# Patient Record
Sex: Female | Born: 1965 | Race: White | Hispanic: No | Marital: Married | Smoking: Never smoker
Health system: Southern US, Community
[De-identification: ages and names within clinical notes are randomized; demographics above are authoritative.]

## PROBLEM LIST (undated history)

## (undated) DIAGNOSIS — R51 Headache: Secondary | ICD-10-CM

## (undated) DIAGNOSIS — Z9889 Other specified postprocedural states: Secondary | ICD-10-CM

## (undated) DIAGNOSIS — Z85850 Personal history of malignant neoplasm of thyroid: Secondary | ICD-10-CM

## (undated) DIAGNOSIS — R112 Nausea with vomiting, unspecified: Secondary | ICD-10-CM

## (undated) DIAGNOSIS — R0602 Shortness of breath: Secondary | ICD-10-CM

## (undated) DIAGNOSIS — M2550 Pain in unspecified joint: Secondary | ICD-10-CM

## (undated) DIAGNOSIS — F1990 Other psychoactive substance use, unspecified, uncomplicated: Secondary | ICD-10-CM

## (undated) DIAGNOSIS — R413 Other amnesia: Secondary | ICD-10-CM

## (undated) DIAGNOSIS — I10 Essential (primary) hypertension: Secondary | ICD-10-CM

## (undated) DIAGNOSIS — R519 Headache, unspecified: Secondary | ICD-10-CM

## (undated) DIAGNOSIS — G4730 Sleep apnea, unspecified: Secondary | ICD-10-CM

## (undated) DIAGNOSIS — R0600 Dyspnea, unspecified: Secondary | ICD-10-CM

## (undated) DIAGNOSIS — F32A Depression, unspecified: Secondary | ICD-10-CM

## (undated) DIAGNOSIS — E039 Hypothyroidism, unspecified: Secondary | ICD-10-CM

## (undated) DIAGNOSIS — C801 Malignant (primary) neoplasm, unspecified: Secondary | ICD-10-CM

## (undated) DIAGNOSIS — F319 Bipolar disorder, unspecified: Secondary | ICD-10-CM

## (undated) DIAGNOSIS — K5900 Constipation, unspecified: Secondary | ICD-10-CM

## (undated) DIAGNOSIS — N183 Chronic kidney disease, stage 3 unspecified: Secondary | ICD-10-CM

## (undated) DIAGNOSIS — E079 Disorder of thyroid, unspecified: Secondary | ICD-10-CM

## (undated) DIAGNOSIS — E559 Vitamin D deficiency, unspecified: Secondary | ICD-10-CM

## (undated) DIAGNOSIS — F329 Major depressive disorder, single episode, unspecified: Secondary | ICD-10-CM

## (undated) DIAGNOSIS — M549 Dorsalgia, unspecified: Secondary | ICD-10-CM

## (undated) DIAGNOSIS — K219 Gastro-esophageal reflux disease without esophagitis: Secondary | ICD-10-CM

## (undated) DIAGNOSIS — F419 Anxiety disorder, unspecified: Secondary | ICD-10-CM

## (undated) DIAGNOSIS — G43909 Migraine, unspecified, not intractable, without status migrainosus: Secondary | ICD-10-CM

## (undated) HISTORY — DX: Dorsalgia, unspecified: M54.9

## (undated) HISTORY — PX: TUBAL LIGATION: SHX77

## (undated) HISTORY — PX: WISDOM TOOTH EXTRACTION: SHX21

## (undated) HISTORY — DX: Chronic kidney disease, stage 3 unspecified: N18.30

## (undated) HISTORY — PX: THYROIDECTOMY: SHX17

## (undated) HISTORY — DX: Depression, unspecified: F32.A

## (undated) HISTORY — PX: LASIK: SHX215

## (undated) HISTORY — DX: Other psychoactive substance use, unspecified, uncomplicated: F19.90

## (undated) HISTORY — PX: OTHER SURGICAL HISTORY: SHX169

## (undated) HISTORY — DX: Vitamin D deficiency, unspecified: E55.9

## (undated) HISTORY — DX: Pain in unspecified joint: M25.50

## (undated) HISTORY — DX: Personal history of malignant neoplasm of thyroid: Z85.850

## (undated) HISTORY — DX: Essential (primary) hypertension: I10

## (undated) HISTORY — PX: APPENDECTOMY: SHX54

## (undated) HISTORY — PX: HEMORROIDECTOMY: SUR656

## (undated) HISTORY — DX: Shortness of breath: R06.02

## (undated) HISTORY — DX: Constipation, unspecified: K59.00

## (undated) HISTORY — DX: Anxiety disorder, unspecified: F41.9

---
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
## 1998-03-02 DIAGNOSIS — C801 Malignant (primary) neoplasm, unspecified: Secondary | ICD-10-CM

## 1998-03-02 HISTORY — PX: THYROIDECTOMY: SHX17

## 1998-03-02 HISTORY — DX: Malignant (primary) neoplasm, unspecified: C80.1

## 1999-09-04 ENCOUNTER — Other Ambulatory Visit: Admission: RE | Admit: 1999-09-04 | Discharge: 1999-09-04 | Payer: Self-pay | Admitting: Obstetrics & Gynecology

## 2000-05-12 ENCOUNTER — Encounter: Payer: Self-pay | Admitting: Obstetrics and Gynecology

## 2000-05-12 ENCOUNTER — Encounter: Admission: RE | Admit: 2000-05-12 | Discharge: 2000-05-12 | Payer: Self-pay | Admitting: Obstetrics and Gynecology

## 2000-12-24 ENCOUNTER — Other Ambulatory Visit: Admission: RE | Admit: 2000-12-24 | Discharge: 2000-12-24 | Payer: Self-pay | Admitting: Obstetrics and Gynecology

## 2004-10-21 ENCOUNTER — Inpatient Hospital Stay (HOSPITAL_COMMUNITY): Admission: AD | Admit: 2004-10-21 | Discharge: 2004-10-23 | Payer: Self-pay | Admitting: Obstetrics and Gynecology

## 2004-12-08 ENCOUNTER — Other Ambulatory Visit: Admission: RE | Admit: 2004-12-08 | Discharge: 2004-12-08 | Payer: Self-pay | Admitting: Obstetrics and Gynecology

## 2006-03-06 ENCOUNTER — Inpatient Hospital Stay (HOSPITAL_COMMUNITY): Admission: AD | Admit: 2006-03-06 | Discharge: 2006-03-07 | Payer: Self-pay | Admitting: Obstetrics and Gynecology

## 2006-03-06 ENCOUNTER — Encounter (INDEPENDENT_AMBULATORY_CARE_PROVIDER_SITE_OTHER): Payer: Self-pay | Admitting: Specialist

## 2006-04-02 ENCOUNTER — Encounter (INDEPENDENT_AMBULATORY_CARE_PROVIDER_SITE_OTHER): Payer: Self-pay | Admitting: Specialist

## 2006-04-02 ENCOUNTER — Ambulatory Visit (HOSPITAL_COMMUNITY): Admission: AD | Admit: 2006-04-02 | Discharge: 2006-04-02 | Payer: Self-pay | Admitting: Obstetrics and Gynecology

## 2006-12-27 ENCOUNTER — Inpatient Hospital Stay (HOSPITAL_COMMUNITY): Admission: AD | Admit: 2006-12-27 | Discharge: 2006-12-27 | Payer: Self-pay | Admitting: Obstetrics and Gynecology

## 2006-12-27 IMAGING — US US OB COMP LESS 14 WK
1 series · 14 of 28 positions shown · non-contrast
Comparison: none

CLINICAL DATA: 40-year-old female with positive pregnancy test.  Estimated gestation of 6 weeks 5 days by LMP.  Pelvic pain.  
 OBSTETRICAL ULTRASOUND <14 WKS AND TRANSVAGINAL OB US:
TECHNIQUE: Both transabdominal and transvaginal ultrasound examinations were performed for complete evaluation of the gestation as well as the maternal uterus, adnexal regions, and pelvic cul-de-sac.

[Series 1: us ob comp less 14 wk · 0.30mm/px · 14 of 33 slices shown]
[im 2/33]
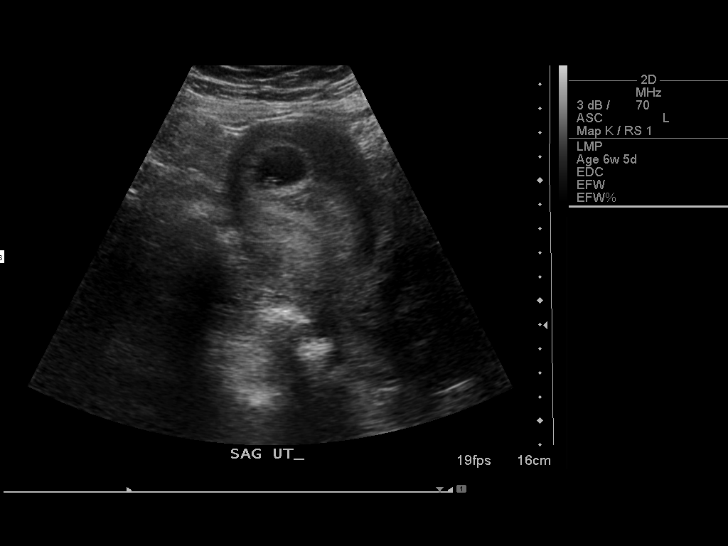
[im 4/33]
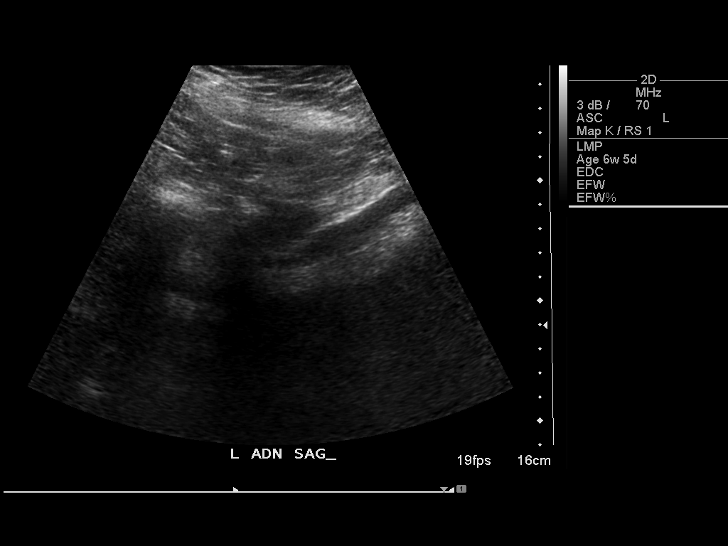
[im 6/33]
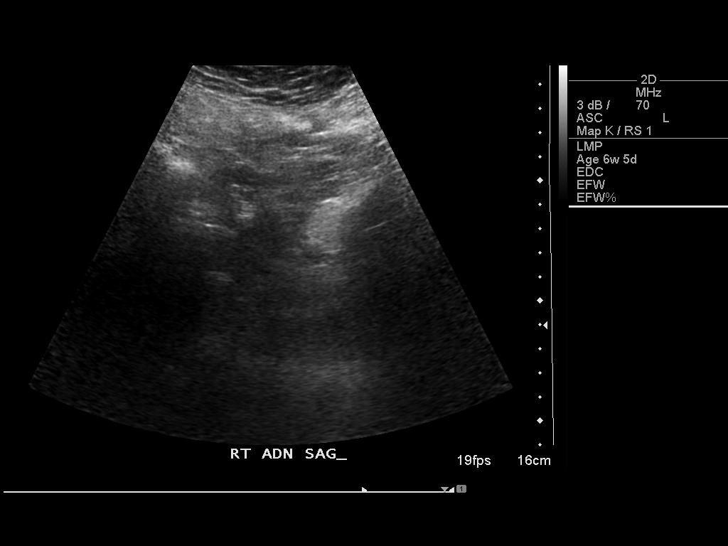
[im 9/33]
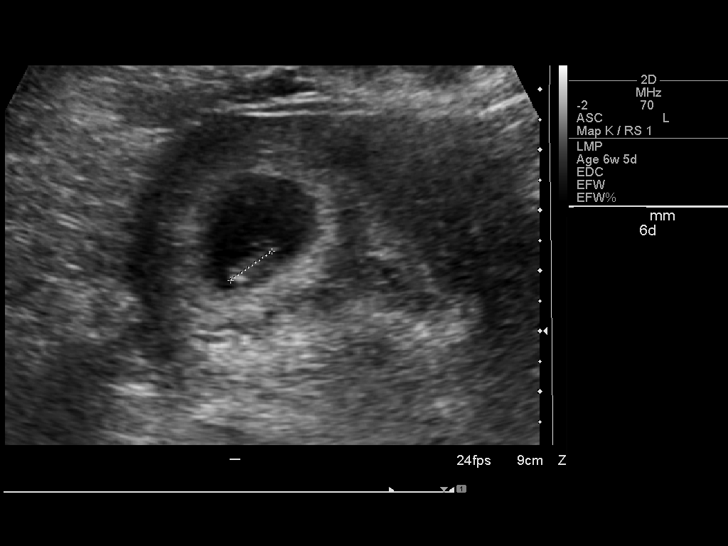
[im 11/33]
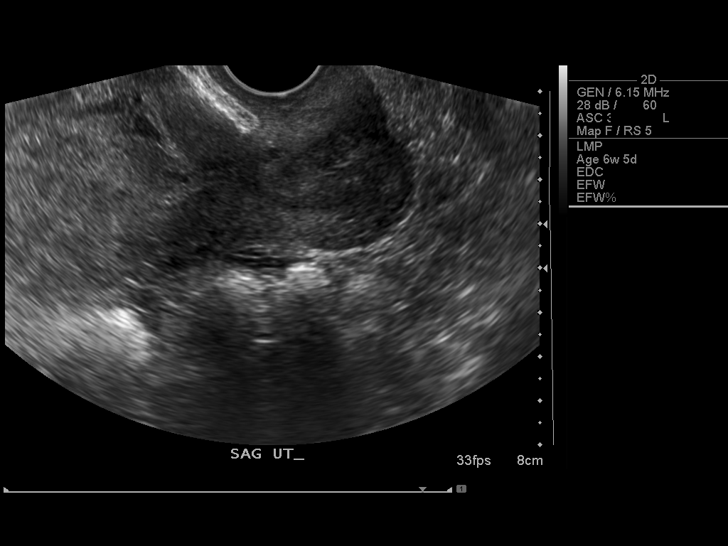
[im 14/33]
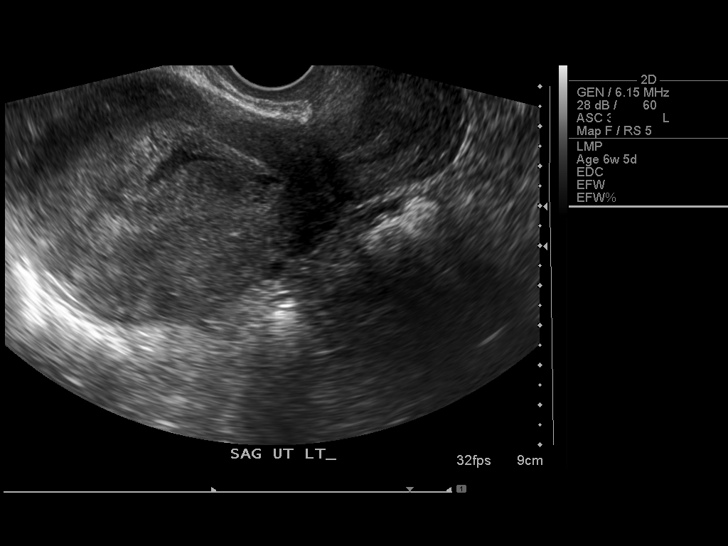
[im 16/33]
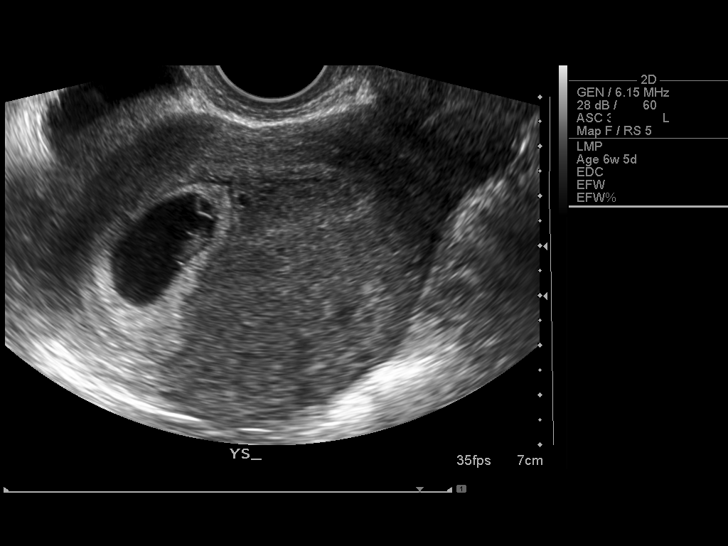
[im 18/33]
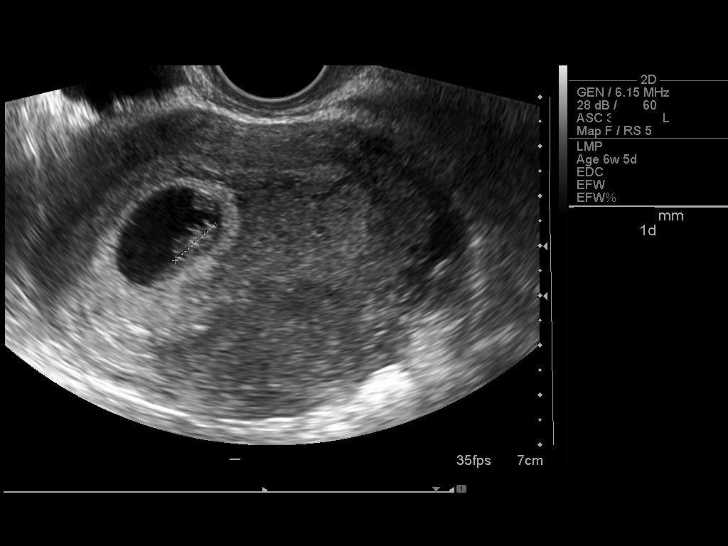
[im 21/33]
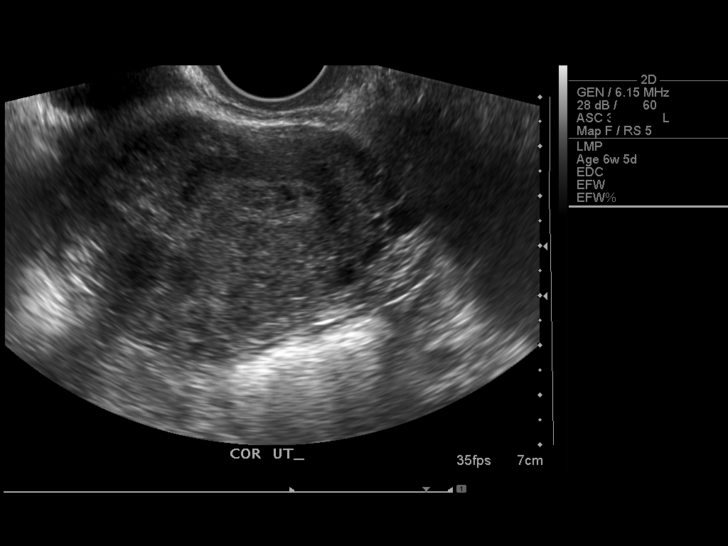
[im 23/33]
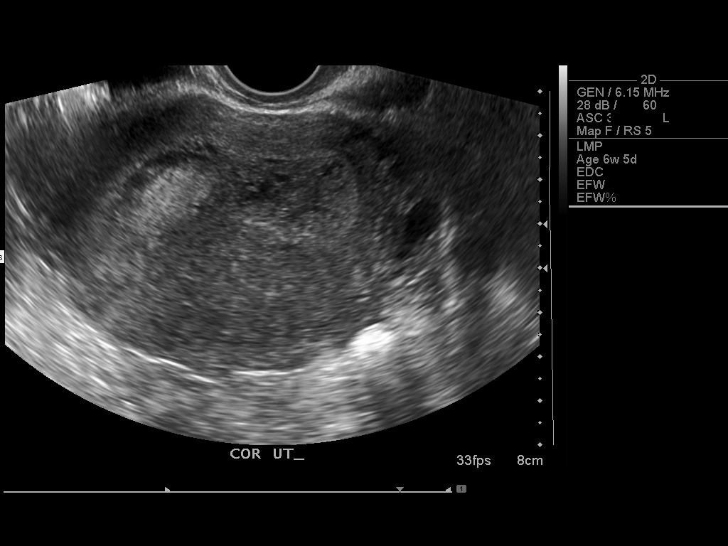
[im 25/33]
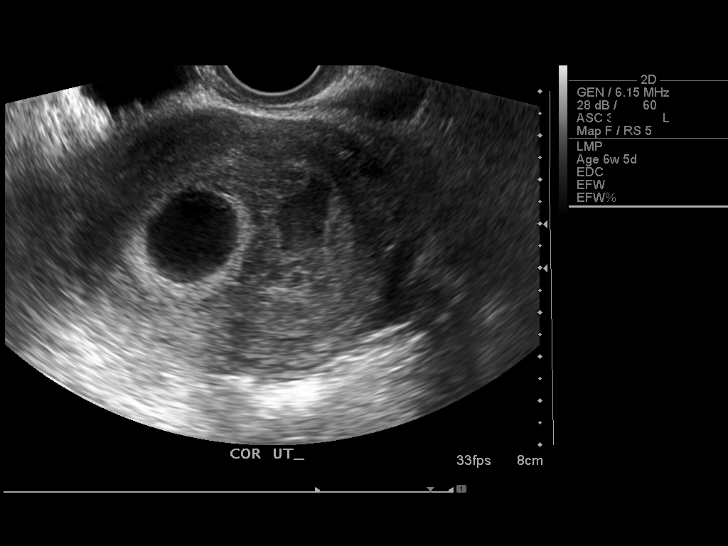
[im 28/33]
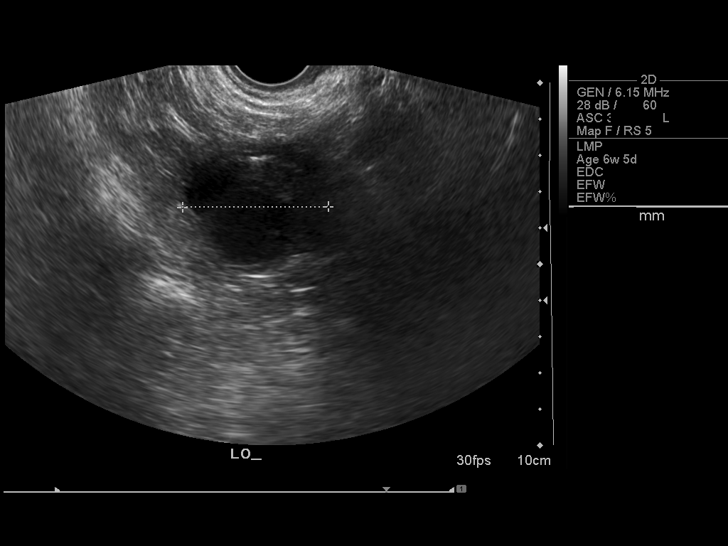
[im 30/33]
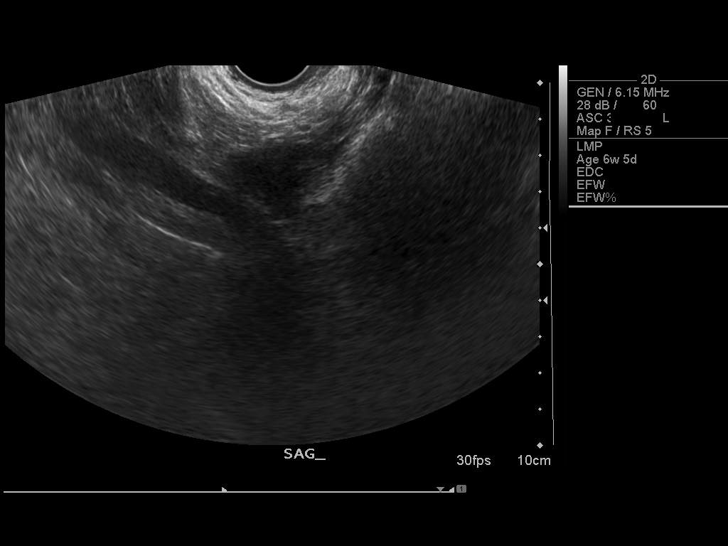
[im 33/33]
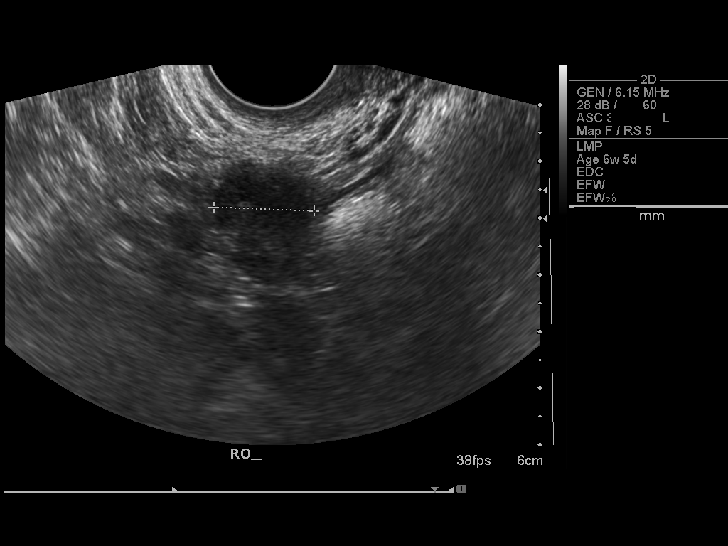

[14 of 28 positions shown; findings below may reference images not displayed]

FINDINGS: There is a single living intrauterine gestation with yolk sac and fetal pole.  Crown-rump length measures 10 mm, corresponding to a gestational age of 7 weeks 0 days.  Small to moderate subchorionic hemorrhage is noted.  The ovaries bilaterally are unremarkable.  No free fluid or adnexal masses identified.
IMPRESSION: 1.  Single living intrauterine pregnancy with estimated gestational age of 7 weeks 0 days by this ultrasound.  
 2.  Small to moderate subchorionic hemorrhage.

## 2007-06-30 ENCOUNTER — Inpatient Hospital Stay (HOSPITAL_COMMUNITY): Admission: AD | Admit: 2007-06-30 | Discharge: 2007-06-30 | Payer: Self-pay | Admitting: Obstetrics and Gynecology

## 2007-07-11 ENCOUNTER — Inpatient Hospital Stay (HOSPITAL_COMMUNITY): Admission: AD | Admit: 2007-07-11 | Discharge: 2007-07-11 | Payer: Self-pay | Admitting: Obstetrics and Gynecology

## 2007-07-26 ENCOUNTER — Ambulatory Visit (HOSPITAL_COMMUNITY): Admission: RE | Admit: 2007-07-26 | Discharge: 2007-07-26 | Payer: Self-pay | Admitting: Obstetrics and Gynecology

## 2007-08-01 ENCOUNTER — Inpatient Hospital Stay (HOSPITAL_COMMUNITY): Admission: RE | Admit: 2007-08-01 | Discharge: 2007-08-01 | Payer: Self-pay | Admitting: Obstetrics and Gynecology

## 2007-08-02 ENCOUNTER — Inpatient Hospital Stay (HOSPITAL_COMMUNITY): Admission: AD | Admit: 2007-08-02 | Discharge: 2007-08-06 | Payer: Self-pay | Admitting: Obstetrics and Gynecology

## 2007-08-03 ENCOUNTER — Encounter (INDEPENDENT_AMBULATORY_CARE_PROVIDER_SITE_OTHER): Payer: Self-pay | Admitting: Obstetrics and Gynecology

## 2007-08-09 ENCOUNTER — Encounter: Admission: RE | Admit: 2007-08-09 | Discharge: 2007-09-07 | Payer: Self-pay | Admitting: Obstetrics and Gynecology

## 2007-09-08 ENCOUNTER — Encounter: Admission: RE | Admit: 2007-09-08 | Discharge: 2007-10-08 | Payer: Self-pay | Admitting: Obstetrics and Gynecology

## 2007-10-09 ENCOUNTER — Encounter: Admission: RE | Admit: 2007-10-09 | Discharge: 2007-11-08 | Payer: Self-pay | Admitting: Obstetrics and Gynecology

## 2007-11-09 ENCOUNTER — Encounter: Admission: RE | Admit: 2007-11-09 | Discharge: 2007-12-09 | Payer: Self-pay | Admitting: Obstetrics and Gynecology

## 2007-12-10 ENCOUNTER — Encounter: Admission: RE | Admit: 2007-12-10 | Discharge: 2008-01-09 | Payer: Self-pay | Admitting: Obstetrics and Gynecology

## 2008-01-10 ENCOUNTER — Encounter: Admission: RE | Admit: 2008-01-10 | Discharge: 2008-02-08 | Payer: Self-pay | Admitting: Obstetrics and Gynecology

## 2008-02-09 ENCOUNTER — Encounter: Admission: RE | Admit: 2008-02-09 | Discharge: 2008-03-06 | Payer: Self-pay | Admitting: Obstetrics and Gynecology

## 2010-03-23 ENCOUNTER — Encounter: Payer: Self-pay | Admitting: Obstetrics and Gynecology

## 2010-05-07 ENCOUNTER — Other Ambulatory Visit: Payer: Self-pay | Admitting: Obstetrics and Gynecology

## 2010-05-07 ENCOUNTER — Ambulatory Visit (HOSPITAL_COMMUNITY)
Admission: RE | Admit: 2010-05-07 | Discharge: 2010-05-07 | Disposition: A | Discharge status: Home / Self Care | Payer: PRIVATE HEALTH INSURANCE | Source: Ambulatory Visit | Attending: Obstetrics and Gynecology | Admitting: Obstetrics and Gynecology

## 2010-05-07 DIAGNOSIS — D250 Submucous leiomyoma of uterus: Secondary | ICD-10-CM | POA: Insufficient documentation

## 2010-05-07 DIAGNOSIS — N920 Excessive and frequent menstruation with regular cycle: Secondary | ICD-10-CM | POA: Insufficient documentation

## 2010-05-07 LAB — CBC
MCH: 23.7 pg — ABNORMAL LOW (ref 26.0–34.0)
RBC: 3.88 MIL/uL (ref 3.87–5.11)

## 2010-05-09 NOTE — Op Note (Signed)
Yolanda Jordan, Yolanda Jordan                   ACCOUNT NO.:  [id number]  MEDICAL RECORD NO.:  [id number]           PATIENT TYPE:  O  LOCATION:  [facility]                          [facility]  PHYSICIAN:  Huel Cote, M.D. DATE OF BIRTH:  16-Jan-1966  DATE OF PROCEDURE:  05/07/2010 DATE OF DISCHARGE:                              OPERATIVE REPORT   PREOPERATIVE DIAGNOSIS:  Menorrhagia.  POSTOPERATIVE DIAGNOSIS:  Menorrhagia with submucosal fibroid.  PROCEDURES: 1. Hysteroscopy. 2. NovaSure ablation. 3. Resection of fibroid.  SURGEON:  Huel Cote, MD  ANESTHESIA:  MAC and 1% paracervical block.  FINDINGS:  There is a submucosal fibroid on a stalk approximately 1 cm in diameter in the upper left cavity of the uterus on the anterior surface.  Normal cavity was noted, otherwise.  SPECIMEN:  Fibroid pieces and endometrial curettings were sent to Pathology.  The cavity length itself was 6.  The cavity width was 4.5.  A treatment time of 1 minute and 10 seconds, NovaSure ablation.  Hysteroscopic deficit was 75 mL.  There was minimal blood loss.  PROCEDURE:  The patient was taken to the operating room where MAC anesthesia was obtained without difficulty.  She was then prepped and draped in a normal sterile fashion in dorsal lithotomy position.  With a speculum in the vagina, the cervix was identified.  Injected with approximately 2 mL of 1% lidocaine and grasped with tenaculum.  The remaining 2 and 10 o'clock areas were injected with 9 mL each of 1% lidocaine for paracervical block.  The uterus was then sounded to 11 cm. Cervical length measured at 5 cm for a cavity length of 6.  The dilators passed very easily due to prior Cytotec treatment by the patient.  The resectoscope was then introduced into the uterine cavity and with sorbitol, the cavity was inspected and found to be normal except for the small submucosal fibroid noted in the upper anterior left quadrant. This was then  serially resected with the resectoscope off the stalk that was slightly smaller and 1 cm and appeared to be resected in entirety. Once this was resected, endometrial curettings were performed and handed off to Pathology.  Once these were performed, the cavity was then flushed out of the sorbitol and switched over to lactated Ringer's which was flushed through the cavity multiple times to clear out the sorbitol. Once this was performed with approximately 300 mL, the NovaSure unit was introduced into the uterine cavity and the unit deployed easily.  The cavity width was noted to be 4.5 cm and the unit was then tested with the cervical seal in place and passed its uterine cavity check.  This then was activated for a treatment time of 1 minute and 10 seconds and no other issues noted.  The unit was then removed and the hysteroscope replaced in the uterine cavity with a good blanching of the endometrial cavity noted in all areas for apparent successful treatment.  This was then removed and the tenaculum removed.  There was no active bleeding and the patient was then awakened and taken to the recovery room in good  condition.     Huel Cote, M.D.     KR/MEDQ  D:  05/07/2010  T:  05/07/2010  Job:  [id number]  Electronically Signed by Huel Cote M.D. on 05/08/2010 11:20:17 AM

## 2010-05-09 NOTE — H&P (Signed)
Yolanda Jordan, Yolanda Jordan                   ACCOUNT NO.:  [id number]  MEDICAL RECORD NO.:  [id number]           PATIENT TYPE:  O  LOCATION:  [facility]                           [facility]  PHYSICIAN:  Huel Cote, M.D. DATE OF BIRTH:  May 19, 1965  DATE OF ADMISSION:  05/07/2010 DATE OF DISCHARGE:                             HISTORY & PHYSICAL   The patient is a 45 year old G4, P4 who is coming in for a scheduled hysteroscopy fibroid resection and probable NovaSure ablation.  The patient reported menorrhagia which has been ongoing for several years and has become much worse over the last year.  Cycles are very heavy every month, lasting 5-7 days and have actually made the patient somewhat anemic with a hemoglobin of 10 at her last annual exam.  The patient also has some stress incontinence, which is troublesome, but does not wish to intervene with that problem at this time.  She has been worked up with saline-infused ultrasound which revealed a uterine cavity that was essentially normal.  There was a 10 x 9 x 8 small submucosal fibroid protruding into the cavity along the left fundal wall.  The cavity itself appears slightly on the large side of normal.  The patient had been counseled as to her options and desired to proceed with a hysteroscopy, resection of the submucosal fibroid, and NovaSure ablation as it would appear that this would work based on the cavity findings at the time of surgery.  Her past obstetrical history is significant for two vaginal deliveries and two low transverse cesarean sections, the second one was a tubal ligation performed as well.  Her past medical history significant for bipolar hypothyroidism and psoriasis.  PAST SURGICAL HISTORY:  Low transverse C-section x2, thyroidectomy, and appendectomy.  Her past GYN history is significant for stress urinary incontinence and mild pelvic prolapse.  Her current medications include lithium, Abilify, Zoloft, and  Synthroid.  She has no known drug allergies.  PHYSICAL EXAMINATION:  VITAL SIGNS:  The patient's weight is 234 pounds, height is 5 feet 9 inches. CARDIAC:  Regular rate and rhythm. LUNGS:  Clear. ABDOMEN:  Soft and nontender.  She has a probable lipoma in the left lower quadrant. PELVIC:  She has normal external genitalia with a moderate rectocele. Cervix is well supported and a mild cystocele.  Uterus is well supported in upper limits of normal in size.  Bimanual has no masses.  The patient was counseled as to her options including a possible posterior repair and evaluation for sling placement.  She did not wish any prolapse repair at this time, wishing only to proceed with the ablation.  We discussed the risks and benefits of ablation including uterine perforation, bleeding, and possible infection.  The patient understands these risks and desires to proceed as stated.  We discussed that we will proceed hysteroscopically and resect the small fibroid first and evaluate the cavity for the ablation and if appropriate proceed with that.  She will use Cytotec 400 mcg prior to procedure to have her cervix more easily ready for dilation.  Again all questions were answered and  the procedure was discussed in detail and the patient will proceed as directed.     Huel Cote, M.D.     KR/MEDQ  D:  05/06/2010  T:  05/06/2010  Job:  [id number]  Electronically Signed by Huel Cote M.D. on 05/08/2010 11:20:13 AM

## 2010-07-15 NOTE — H&P (Signed)
Yolanda Jordan, Yolanda Jordan                   ACCOUNT NO.:  [id number]   MEDICAL RECORD NO.:  [id number]          PATIENT TYPE:  INP   LOCATION:  NA                            [facility]   PHYSICIAN:  Huel Cote, M.D. DATE OF BIRTH:  05-19-1965   DATE OF ADMISSION:  08/02/2007  DATE OF DISCHARGE:                              HISTORY & PHYSICAL   The patient's day of admission is today, August 02, 2007, at 8:30 p.m. at  the [facility] facility on the labor and delivery unit.   HISTORY AND PHYSICAL:  The patient is a 45 year old, G5, P3-0-1-3 who is  coming in for a scheduled induction at 38+ weeks gestation given an  ongoing finding of macrosomia.  The patient's estimated due date is August 15, 2007, by a 9-week ultrasound consistent with LMP, and the patient  had a recent ultrasound with estimated fetal weight of 9 pounds 7  ounces. At the same time, she had an amniocentesis performed which did  demonstrate mature lungs.  The patient's baby has also been breech in  presentation in the last few weeks, however, spontaneously converted to  vertex.  The patient's pregnancy is complicated by gestational diabetes  for which she has been on significant amount of insulin. She is  currently on Dilantin 68 units nightly and is taking Humalog with each  meal for coverage.  The patient also has had polyhydramnios noted at  times during the pregnancy; however, with the most current ultrasound,  her AFI was normal at 15.  As stated, the last weight 2 days ago was  estimated at 9 pounds 7 ounces at the 90th percentile.  She is of  advanced maternal age.  However, had normal genetic screening as well as  normal ultrasound.  A first trimester screen was normal, and she did  have a fetal echocardiogram performed secondary to the diabetes which  was also normal. The patient's pregnancy has also been complicated by  history of a C-section with her second pregnancy.  Her first delivery  was vaginal  delivery followed by C-section followed by another vaginal  delivery, and it is her desire that she VBAC with this delivery as well   Her prenatal labs are as follows.  O positive, antibody negative, RPR  nonreactive, rubella immune, hepatitis B surface antigen negative, HIV  negative, GC negative, chlamydia negative, group B strep negative, 1-  hour Glucola not performed secondary to diabetes. Thyroid was normal.  First trimester screen was normal   Gestational diabetes, was originally started on glyburide and covered  with this effectively until she reached approximately 33 weeks. At that  point, she was noted to have elevated fasting blood sugars which were no  longer controlled, and most of her postprandials were 150-160, and at  that point she was started on insulin. She also at that visit at 33  weeks was placed on Zoloft 50 mg p.o. nightly for a history of  depression which had been worsening with the pregnancy. As stated, she  had a normal fetal echocardiogram performed this pregnancy.  PAST OBSTETRICAL HISTORY:  In 1986, she had a 7 pound 11 ounce infant  vaginally without difficulty.  In 1997, she had a 6 pound 10 ounce  infant by C-section secondary to fetal distress. In 2006, she had a  vaginal delivery of an 8 pound infant via VBAC without complications. In  2008, she had a spontaneous loss at 17 weeks IUFD.   PAST MEDICAL HISTORY:  1. History of diabetes as stated.  The patient probably has some      glucose intolerance even when not pregnant.  However, usually does      go to more normal blood sugars after delivery.  2. History of depression, more significant postpartum depression in      the past and, therefore, has been started on Zoloft.  3. History of thyroidectomy with a low resulting thyroid on Synthroid,      stable.   PAST GYN HISTORY:  Questionable history of HPV, none recently.   PAST SURGICAL HISTORY:  1. Thyroidectomy in 1999.  2. Appendectomy as a  teenager.  3. C-section in 1997.   ALLERGIES:  None.   CURRENT MEDICATIONS:  1. Lantus 68 mg p.o. nightly.  2. Sliding scale Humalog coverage.  3. Zoloft 50 mg p.o. nightly.  4. Synthroid p.o. daily.   PHYSICAL EXAMINATION:  VITAL SIGNS:  On admission, the patient's weight  is 268 pounds. Blood pressure normal at 120/72.  CARDIAC:  Regular rate and rhythm.  LUNGS:  Clear.  ABDOMEN:  Soft and nontender and gravid.  Fundal height is approximately  40-41.  PELVIC:  Cervix is 50, closed, and high.   PLAN:  The patient had an amniocentesis performed on August 01, 2007, with  an LS ratio of 2.5 with PG present indicating mature fetal lungs given  the large size of the baby at 9 pounds 7 ounces, the patient has  requested a trial of induction.  It was discussed with her extensively  that, with an unfavorable cervix and history of a C-section, we are  somewhat limited in our induction protocol and that it could be  unsuccessful leading to a repeat C-section.  The patient is well aware  of this and strongly desires a trial of induction. For this reason, we  have planned admission with possible Foley bulb and Pitocin induction to  proceed after admission. The ultrasound performed on August 01, 2007,  revealed a vertex fetus, although she had been breech several weeks ago.  The risks and benefits of VBAC have been discussed with the patient  extensively, and she understands the risk of uterine rupture with a  trial of labor, however, desires this trial.  She will be instructed to  use 1/2 of her Lantus dose this evening at the hospital and will have  her blood sugars managed in labor as needed.      Huel Cote, M.D.  Electronically Signed     KR/MEDQ  D:  08/02/2007  T:  08/02/2007  Job:  [id number]

## 2010-07-15 NOTE — Discharge Summary (Signed)
NAMEJANNELLE, Yolanda Jordan                   ACCOUNT NO.:  [id number]   MEDICAL RECORD NO.:  [id number]          PATIENT TYPE:  INP   LOCATION:  9115                          [facility]   PHYSICIAN:  Sherron Monday, MD        DATE OF BIRTH:  December 18, 1965   DATE OF ADMISSION:  08/02/2007  DATE OF DISCHARGE:  08/06/2007                               DISCHARGE SUMMARY   ADMITTING DIAGNOSIS:  Intrauterine pregnancy at term, gestational  diabetes, history of low-transverse cesarean section, desires vaginal  birth after cesarean section.   DISCHARGE DIAGNOSIS:  Intrauterine pregnancy at term, gestational  diabetes, history of low-transverse cesarean section, desires vaginal  birth after cesarean section delivered via repeat low transverse  cesarean section.   HISTORY OF PRESENT ILLNESS:  A 45 year old G5, P3-0-1-3 who is scheduled  for induction at 38 plus weeks given the finding of macrosomia and fetal  lung maturity by amnio.  She was dated by a 9-week ultrasound consistent  with an LMP.  The recent ultrasound estimated fetal weight at 9 pounds 7  ounces.  Her pregnancy is complicated by gestational diabetes, and she  is taking 60 units of Lantus nightly and Humalog with meals.  She also  has advanced maternal age; however, she had normal genetic screening and  ultrasound.  Gestational diabetes for which she was originally started  on glyburide which controlled her sugars until she is 33 weeks. At this  point, she had been switched to Lantus and Humalog secondary to  uncontrolled sugars.   PAST MEDICAL HISTORY:  1. Significant for diabetes with some history of glucose intolerance      even when she is not pregnant.  2. Past history of depression.  She is started back on her Zoloft.  3. History of thyroidectomy with resulting hypothyroidism, and she is      on Synthroid.   PAST SURGICAL HISTORY:  Significant for low-transverse cesarean section  as well as thyroidectomy as well as an  appendectomy.   PAST OB/GYN HISTORY:  G1 is 7 pounds 11 ounce, NSVD; G2 is 6 pound 10  ounce by cesarean section secondary to fetal distress; 2006, VBAC 8  pound infant; 2008 17 week IFD; and G5 is present pregnancy.  She has a  questionable history of HPV, and no problems with that recently.   MEDICATIONS:  Currently include Lantus, Humalog, Zoloft, and Synthroid.   ALLERGIES:  None.   SOCIAL HISTORY:  Denies alcohol, tobacco, or drug use. She is married.   PHYSICAL EXAMINATION:  On admission, she is afebrile.  Vital signs  stable and benign exam.   She was admitted after discussion of an unfavorable cervix and induction  of a VBAC and a Foley bulb was placed after her cervical exam was found  to be 1-2 cm dilated.  After the Foley came out, she was started on  Pitocin.  In her labor she was started on insulin drip secondary to  elevated sugars.  She received her epidural the morning of August 03, 2007.  AROM was performed with a  fetal scalp electrode secondary to  polyhydramnios for controlled descent of the vertex.  She was monitored  closely.  She continued in the labor and did not progress, 5-6 cm  dilated, 50% effaced, and baby remained out of the pelvis.  The decision  was made to proceed with a cesarean section as well as postpartum  bilateral tubal ligation.  The patient delivered a viable female infant  with Apgars of 9 at 9 and weight of 9 pounds 3 ounces, and tubes and  ovaries were noted to be within normal limits and the tubal ligation was  performed.  Her postoperative course was relatively uncomplicated.  She  had some problems with nausea and dizziness, however, pain was okay.  Her sugars were followed and were reasonably controlled.  She will  continue to monitor them after she is discharged.  She was discharged to  home on postoperative day #3, doing well with routine discharge  instructions and numbers to call with any questions or problems.  She  was also discharged  to home with prescription for Motrin, Vicodin, and  prenatal vitamins, and she will continue to check her fingertip blood  sugars.  She will follow up in the office in approximately 2 weeks.  She  will plan to breast-feed and has a bilateral tubal ligation for  contraception.  Her hemoglobin decreased from 12.2 to 10.3.  She is O  positive and rubella immune.      Sherron Monday, MD  Electronically Signed     JB/MEDQ  D:  08/06/2007  T:  08/06/2007  Job:  [id number]

## 2010-07-15 NOTE — Op Note (Signed)
Yolanda Jordan, Yolanda Jordan                   ACCOUNT NO.:  [id number]   MEDICAL RECORD NO.:  [id number]          PATIENT TYPE:  INP   LOCATION:  9120                          [facility]   PHYSICIAN:  Huel Cote, M.D. DATE OF BIRTH:  14-Mar-1965   DATE OF PROCEDURE:  08/03/2007  DATE OF DISCHARGE:                               OPERATIVE REPORT   PREOPERATIVE DIAGNOSES:  1. Term pregnancy at 92 plus weeks' delivery.  2. Previous cesarean section.  3. Macrosomia and polyhydramnios.  4. Status post amnio with mature fetal lungs.  5. Desires permanent sterilization.  6. Arrest of dilation and induced labor.   POSTOPERATIVE DIAGNOSES:  1. Term pregnancy at 45 plus weeks' delivery.  2. Previous cesarean section.  3. Macrosomia and polyhydramnios.  4. Status post amnio with mature fetal lungs.  5. Desires permanent sterilization.  6. Arrest of dilation and induced labor.   PROCEDURES:  Repeat low-transverse cesarean section and bilateral tubal  ligation.   SURGEON:  Huel Cote, MD   ANESTHESIA:  Epidural.   FINDINGS:  A viable female infant in the vertex presentation, Apgars were  9 and 9, and weight was 9 pounds 3 ounces.  There was nuchal cord x1.  Placenta was normal.  Tubes and ovaries were normal.  The placenta was  sent to Labor and Delivery.   ESTIMATED BLOOD LOSS:  800 mL.   URINE OUTPUT:  175 mL of clear urine.   IV FLUIDS:  1600 mL LR.   PROCEDURE:  The patient was taken to the operating room where epidural  anesthesia was found to be adequate by Allis clamp test.  She was then  prepped and draped in a normal sterile fashion in the dorsal supine  position with a leftward tilt.  A Pfannenstiel skin incision was made  through a preexisting scar and carried through to the underlying layer  of fascia by sharp dissection and Bovie cautery.  The fascia was then  nicked in the midline, and the incision was extended laterally with Mayo  scissors.  The inferior aspect  of the incision was grasped with Kocher  clamps and elevated and dissected off the underlying rectus muscles.  The superior aspect was likewise dissected off the rectus muscles.  The  rectus muscles were then separated in the midline, and the peritoneal  cavity entered carefully with blunt dissection.  This incision was then  extended both superiorly and anteriorly with careful attention to avoid  both bowel and bladder.  The Alexis self-retaining wound retractor was  then placed within the incision, and the lower uterine segment was  exposed nicely.  A transverse incision was then made in the cavity  itself, entered bluntly, this was then extended bluntly, and the  infant's head delivered easily atraumatically.  The nose and mouth were  bulb suctioned.  Nuchal cord x1 was reduced over the head, and the  remainder of the infant was delivered without difficulty.  Nose and  mouth were bulb suctioned, and the cord was clamped, cut, and he infant  handed to the awaiting pediatricians.  The placenta was then expressed  spontaneously and handed off for cord blood donation.  The uterus was  cleared of all clots and debris with moist lap sponge.  The uterine  incision itself was closed in two layers, the first a running locked  layer and the second an imbricating layer of both of 0 chromic.  The  incision then appeared hemostatic, so attention was turned to the  patient's fallopian tubes.  The left fallopian tube was grasped with a  Babcock clamp, elevated and dissected, and traced out to its fimbriated  end.  A small opening was made in the mesosalpinx with a Bovie cautery  and two free ties of 0 plain passed around the tubes in approximately 2-  to 3-cm segment.  This was then amputated and the pedicles remaining  were treated with Bovie cautery.  There was no active bleeding noted,  and this was returned to the abdomen.  In a similar fashion, the right  tube was traced out to its fimbriated end  and in a similar fashion, tied  off with 2 free ties of 0 plain and amputated approximately 2- to 3-cm  segment of tube.  The free pedicles were likewise cauterized with Bovie  cautery and the tube returned to the abdomen.  At this point, the  incision was re-inspected carefully.  No active bleeding was noted.  The  bowel and omentum were quite protuberant and thus, the rectus muscles  and peritoneum were closed with 0 Vicryl in a running fashion carefully.  The fascia was then closed with 0 Vicryl, the subcutaneous tissue  reapproximated both in a running fashion, and the skin was closed with  staples.  Sponge, lap, and needle counts were correct x2, and the  patient was taken to the recovery room in stable condition.      Huel Cote, M.D.  Electronically Signed     KR/MEDQ  D:  08/03/2007  T:  08/04/2007  Job:  [id number]

## 2010-11-27 LAB — FLM(FETAL LUNG MATURITY), AMNIOTIC FLUID
Fetal/Lung Maturity: 44.8
Gestational Age: 38

## 2010-11-27 LAB — CBC
Hemoglobin: 12.2
MCHC: 34.8
Platelets: 260
RBC: 3.27 — ABNORMAL LOW
RBC: 4
RDW: 14.1
RDW: 14.4
WBC: 10.6 — ABNORMAL HIGH
WBC: 14.9 — ABNORMAL HIGH

## 2010-11-27 LAB — LSPG (L/S RATIO WITH PG)-AMNIO FLUID

## 2010-12-10 LAB — URINALYSIS, ROUTINE W REFLEX MICROSCOPIC
Glucose, UA: NEGATIVE
Hgb urine dipstick: NEGATIVE
Nitrite: NEGATIVE
Urobilinogen, UA: 0.2

## 2011-07-28 DIAGNOSIS — Z85850 Personal history of malignant neoplasm of thyroid: Secondary | ICD-10-CM | POA: Insufficient documentation

## 2011-08-17 ENCOUNTER — Encounter (HOSPITAL_BASED_OUTPATIENT_CLINIC_OR_DEPARTMENT_OTHER): Payer: Self-pay | Admitting: Family Medicine

## 2011-08-17 ENCOUNTER — Emergency Department (HOSPITAL_BASED_OUTPATIENT_CLINIC_OR_DEPARTMENT_OTHER): Payer: Medicaid Other

## 2011-08-17 ENCOUNTER — Emergency Department (HOSPITAL_BASED_OUTPATIENT_CLINIC_OR_DEPARTMENT_OTHER)
Admission: EM | Admit: 2011-08-17 | Discharge: 2011-08-17 | Disposition: A | Discharge status: Home / Self Care | Payer: Medicaid Other | Attending: Emergency Medicine | Admitting: Emergency Medicine

## 2011-08-17 DIAGNOSIS — E079 Disorder of thyroid, unspecified: Secondary | ICD-10-CM | POA: Insufficient documentation

## 2011-08-17 DIAGNOSIS — R609 Edema, unspecified: Secondary | ICD-10-CM | POA: Insufficient documentation

## 2011-08-17 DIAGNOSIS — F319 Bipolar disorder, unspecified: Secondary | ICD-10-CM | POA: Insufficient documentation

## 2011-08-17 DIAGNOSIS — R0602 Shortness of breath: Secondary | ICD-10-CM | POA: Insufficient documentation

## 2011-08-17 HISTORY — DX: Bipolar disorder, unspecified: F31.9

## 2011-08-17 HISTORY — DX: Disorder of thyroid, unspecified: E07.9

## 2011-08-17 LAB — URINALYSIS, ROUTINE W REFLEX MICROSCOPIC
Bilirubin Urine: NEGATIVE
Nitrite: NEGATIVE
Protein, ur: NEGATIVE mg/dL
Specific Gravity, Urine: 1.008 (ref 1.005–1.030)
Urobilinogen, UA: 0.2 mg/dL (ref 0.0–1.0)
pH: 7 (ref 5.0–8.0)

## 2011-08-17 LAB — COMPREHENSIVE METABOLIC PANEL
Albumin: 4 g/dL (ref 3.5–5.2)
BUN: 6 mg/dL (ref 6–23)
Calcium: 9.4 mg/dL (ref 8.4–10.5)
Creatinine, Ser: 0.6 mg/dL (ref 0.50–1.10)
GFR calc Af Amer: >90 mL/min (ref >90)
Glucose, Bld: 106 mg/dL — ABNORMAL HIGH (ref 70–99)
Potassium: 3.5 mEq/L (ref 3.5–5.1)
Sodium: 138 mEq/L (ref 135–145)
Total Bilirubin: 0.1 mg/dL — ABNORMAL LOW (ref 0.3–1.2)

## 2011-08-17 LAB — URINE MICROSCOPIC-ADD ON: WBC, UA: NONE SEEN WBC/hpf (ref <3)

## 2011-08-17 LAB — CBC
HCT: 38.9 % (ref 36.0–46.0)
Hemoglobin: 12.5 g/dL (ref 12.0–15.0)

## 2011-08-17 LAB — PRO B NATRIURETIC PEPTIDE: Pro B Natriuretic peptide (BNP): 25.8 pg/mL (ref 0–125)

## 2011-08-17 MED ORDER — LIDOCAINE 4 % EX CREA
TOPICAL_CREAM | CUTANEOUS | Status: AC
  Filled 2011-08-17: qty 5

## 2011-08-17 MED ORDER — LIDOCAINE 4 % EX CREA
TOPICAL_CREAM | Freq: Once | CUTANEOUS | Status: AC
  Administered 2011-08-17: 1 via TOPICAL

## 2011-08-17 NOTE — ED Provider Notes (Signed)
History     CSN: [id number]  Arrival date & time 08/17/11  1148   First MD Initiated Contact with Patient 08/17/11 1212      Chief Complaint  Patient presents with  . Shortness of Breath    (Consider location/radiation/quality/duration/timing/severity/associated sxs/prior treatment) HPI Patient is a 46 year old female with a history of thyroid disease as well as bipolar disorder who presents today complaining of 3+ pitting edema in the bilateral lower extremities that occurred 6 days ago. At that time the patient took her father's Lasix for several days. She was at the [facility] when this occurred. She reports that this has happened one time previous several months ago when she was away from home in [place]. She has never had a medical workup for this at the time when it has occurred. Patient denies any chest pain or shortness of breath currently that she did have chest pain and shortness of breath when this occurred previously. She has no shortness of breath currently as well as no lower extremity edema.  She has no history of kidney disease or heart failure. The patient reports taking all her medications including her Synthroid. There are no other associated or modifying factors. Past Medical History  Diagnosis Date  . Thyroid disease   . Bipolar 1 disorder     Past Surgical History  Procedure Date  . Thyroidectomy   . Appendectomy   . Cesarean section   . Tubal ligation     History reviewed. No pertinent family history.  History  Substance Use Topics  . Smoking status: Never Smoker   . Smokeless tobacco: Not on file  . Alcohol Use: Yes    OB History    Grav Para Term Preterm Abortions TAB SAB Ect Mult Living                  Review of Systems  Constitutional: Negative.   HENT: Negative.   Eyes: Negative.   Respiratory: Positive for shortness of breath.   Cardiovascular: Positive for leg swelling.  Gastrointestinal: Negative.   Genitourinary:  Negative.   Musculoskeletal: Negative.   Skin: Negative.   Neurological: Negative.   Hematological: Negative.   Psychiatric/Behavioral: Negative.   All other systems reviewed and are negative.    Allergies  Morphine and related  Home Medications   Current Outpatient Rx  Name Route Sig Dispense Refill  . ABILIFY PO Oral Take by mouth.    . TEGRETOL PO Oral Take by mouth.    Marland Kitchen LAMICTAL PO Oral Take by mouth.    Marland Kitchen LORAZEPAM 1 MG PO TABS Oral Take 1 mg by mouth 2 (two) times daily.    Marland Kitchen ZOLOFT PO Oral Take by mouth.      BP 123/72  Pulse 84  Temp 98.5 F (36.9 C) (Oral)  Resp 18  Ht 5\' 9"  (1.753 m)  Wt 255 lb (115.667 kg)  BMI 37.66 kg/m2  SpO2 98%  Physical Exam  Nursing note and vitals reviewed. GEN: Well-developed, well-nourished female in no distress HEENT: Atraumatic, normocephalic. Oropharynx clear without erythema EYES: PERRLA BL, no scleral icterus. NECK: Trachea midline, no meningismus CV: regular rate and rhythm. No murmurs, rubs, or gallops PULM: No respiratory distress.  No crackles, wheezes, or rales. GI: soft, non-tender. No guarding, rebound, or tenderness. + bowel sounds  GU: deferred Neuro: cranial nerves grossly 2-12 intact, no abnormalities of strength or sensation, A and O x 3 MSK: Patient moves all 4 extremities symmetrically, no deformity, edema,  or injury noted Skin: No rashes petechiae, purpura, or jaundice Psych: no abnormality of mood   ED Course  Procedures (including critical care time)   Indication: shortness of breath Please note this EKG was reviewed extemporaneously by myself.   Date: 08/17/2011  Rate: 93  Rhythm: normal sinus rhythm  QRS Axis: normal  Intervals: normal  ST/T Wave abnormalities: nonspecific T wave changes  Conduction Disutrbances:none  Narrative Interpretation: No acute ischemic changes  Old EKG Reviewed: none available      Labs Reviewed  COMPREHENSIVE METABOLIC PANEL - Abnormal; Notable for the  following:    Glucose, Bld 106 (*)     Total Bilirubin 0.1 (*)     All other components within normal limits  URINALYSIS, ROUTINE W REFLEX MICROSCOPIC - Abnormal; Notable for the following:    Hgb urine dipstick SMALL (*)     All other components within normal limits  PRO B NATRIURETIC PEPTIDE  CBC  URINE MICROSCOPIC-ADD ON   Dg Chest 2 View  08/17/2011  *RADIOLOGY REPORT*  Clinical Data: Shortness of breath for 1 week.  CHEST - 2 VIEW  Comparison: None.  Findings: Lungs are clear.  Heart size is normal.  No pneumothorax or pleural fluid.  IMPRESSION: Negative chest.  Original Report Authenticated By: Bernadene Bell. D'ALESSIO, M.D.     1. Shortness of breath       MDM  Patient was evaluated by myself. Based on evaluation and laboratory workup was performed given the patient reported having severe shortness of breath peripheral edema. She noted that she had no chest pain, peripheral edema, or shortness of breath today. She reports that all of this had resolved just this morning. Patient I spoke at length about not taking her father's medications and that she shouldn't be evaluated immediately if she actually is having the symptoms. Patient did have an EKG that was unremarkable as well as normal BNP, CBC, renal panel, troponin, and EKG. Patient has plans for followup the primary care physician. She was discharged in good condition and told to return immediately if she had return of her symptoms rather than to just take her father's Lasix.        Cyndra Numbers, MD 08/17/11 [phone]

## 2011-08-17 NOTE — ED Notes (Signed)
Ela-max cream applied to right AC. Will stick for labs after cream has numbed pt.

## 2011-08-17 NOTE — ED Notes (Addendum)
Pt c/o shob x 1 wk, edema to feet and legs and pt sts she treated self with lasix she borrowed. Pt sts edema is now gone but shob remains. Pt sts abdomen feels "tight" and left calf "hurts a little".

## 2011-08-17 NOTE — ED Notes (Signed)
MD at bedside. 

## 2011-08-17 NOTE — ED Notes (Signed)
[place] and [place] "3+ pitting edema, shortness of breath and a pulse ox of 89%".  States she came home early and had been taking Lasix 40 mg daily.  Stopped taking Tegretol 1 week ago.

## 2011-08-17 NOTE — Discharge Instructions (Signed)
Shortness of Breath Shortness of breath (dyspnea) is the feeling of uneasy breathing. Shortness of breath does not always mean that there is a life-threatening illness. However, shortness of breath requires immediate medical care. CAUSES  Causes for shortness of breath include:  Not enough oxygen in the air (as with high altitudes or with a smoke-filled room).   Short-term (acute) lung disease, including:   Infections such as pneumonia.   Fluid in the lungs, such as heart failure.   A blood clot in the lungs (pulmonary embolism).   Lasting (chronic) lung diseases.   Heart disease (heart attack, angina, heart failure, and others).   Low red blood cells (anemia).   Poor physical fitness. This can cause shortness of breath when you exercise.   Chest or back injuries or stiffness.   Being overweight (obese).   Anxiety. This can make you feel like you are not getting enough air.  DIAGNOSIS  Serious medical problems can usually be found during your physical exam. Many tests may also be done to determine why you are having shortness of breath. Tests include:  Chest X-rays.   Lung function tests.   Blood tests.   Electrocardiography.   Exercise testing.   A cardiac echo.   Imaging scans.  Your caregiver may not be able to find a cause for your shortness of breath after your exam. In this case, it is important to have a follow-up exam with your caregiver as directed.  HOME CARE INSTRUCTIONS   Do not smoke. Smoking is a common cause of shortness of breath. Ask for help to stop smoking.   Avoid being around chemicals that may bother your breathing (paint fumes, dust).   Rest as needed. Slowly resume your usual activities.   If medicines were prescribed, take them as directed for the full length of time directed. This includes oxygen and any inhaled medicines.   Follow up with your caregiver as directed. Waiting to do so or failure to follow up could result in worsening of  your condition and possible disability or death.   Be sure you understand what to do or who to call if your shortness of breath worsens.  SEEK MEDICAL CARE IF:   Your condition does not improve in the time expected.   You have a hard time doing your normal activities even with rest.   You have any side effects or problems with the medicines prescribed.   You develop any new symptoms.  SEEK IMMEDIATE MEDICAL CARE IF:   Your shortness of breath is getting worse.   You feel lightheaded, faint, or develop a cough not controlled with medicines.   You start coughing up blood.   You have pain with breathing.   You have chest pain or pain in your arms, shoulders, or abdomen.   You have a fever.   You are unable to walk up stairs or exercise the way you normally do.   Your symptoms are getting worse.  Document Released: 11/11/2000 Document Revised: 02/05/2011 Document Reviewed: 06/29/2007 ExitCare Patient Information 2012 [employer].Shortness of Breath Shortness of breath (dyspnea) is the feeling of uneasy breathing. Shortness of breath does not always mean that there is a life-threatening illness. However, shortness of breath requires immediate medical care. CAUSES  Causes for shortness of breath include:  Not enough oxygen in the air (as with high altitudes or with a smoke-filled room).   Short-term (acute) lung disease, including:   Infections such as pneumonia.   Fluid in the   lungs, such as heart failure.   A blood clot in the lungs (pulmonary embolism).   Lasting (chronic) lung diseases.   Heart disease (heart attack, angina, heart failure, and others).   Low red blood cells (anemia).   Poor physical fitness. This can cause shortness of breath when you exercise.   Chest or back injuries or stiffness.   Being overweight (obese).   Anxiety. This can make you feel like you are not getting enough air.  DIAGNOSIS  Serious medical problems can usually be found  during your physical exam. Many tests may also be done to determine why you are having shortness of breath. Tests include:  Chest X-rays.   Lung function tests.   Blood tests.   Electrocardiography.   Exercise testing.   A cardiac echo.   Imaging scans.  Your caregiver may not be able to find a cause for your shortness of breath after your exam. In this case, it is important to have a follow-up exam with your caregiver as directed.  HOME CARE INSTRUCTIONS   Do not smoke. Smoking is a common cause of shortness of breath. Ask for help to stop smoking.   Avoid being around chemicals that may bother your breathing (paint fumes, dust).   Rest as needed. Slowly resume your usual activities.   If medicines were prescribed, take them as directed for the full length of time directed. This includes oxygen and any inhaled medicines.   Follow up with your caregiver as directed. Waiting to do so or failure to follow up could result in worsening of your condition and possible disability or death.   Be sure you understand what to do or who to call if your shortness of breath worsens.  SEEK MEDICAL CARE IF:   Your condition does not improve in the time expected.   You have a hard time doing your normal activities even with rest.   You have any side effects or problems with the medicines prescribed.   You develop any new symptoms.  SEEK IMMEDIATE MEDICAL CARE IF:   Your shortness of breath is getting worse.   You feel lightheaded, faint, or develop a cough not controlled with medicines.   You start coughing up blood.   You have pain with breathing.   You have chest pain or pain in your arms, shoulders, or abdomen.   You have a fever.   You are unable to walk up stairs or exercise the way you normally do.   Your symptoms are getting worse.  Document Released: 11/11/2000 Document Revised: 02/05/2011 Document Reviewed: 06/29/2007 ExitCare Patient Information 2012 ExitCare,  LLC. 

## 2011-08-17 NOTE — ED Notes (Signed)
Pt refused IV insertion but agreed to lab collection.

## 2011-08-29 ENCOUNTER — Emergency Department (HOSPITAL_COMMUNITY)
Admission: EM | Admit: 2011-08-29 | Discharge: 2011-08-29 | Disposition: A | Discharge status: Home / Self Care | Payer: Medicaid Other | Attending: Emergency Medicine | Admitting: Emergency Medicine

## 2011-08-29 ENCOUNTER — Encounter (HOSPITAL_COMMUNITY): Payer: Self-pay | Admitting: *Deleted

## 2011-08-29 DIAGNOSIS — R45851 Suicidal ideations: Secondary | ICD-10-CM | POA: Insufficient documentation

## 2011-08-29 DIAGNOSIS — F319 Bipolar disorder, unspecified: Secondary | ICD-10-CM | POA: Insufficient documentation

## 2011-08-29 HISTORY — DX: Malignant (primary) neoplasm, unspecified: C80.1

## 2011-08-29 LAB — COMPREHENSIVE METABOLIC PANEL
Albumin: 4 g/dL (ref 3.5–5.2)
Alkaline Phosphatase: 68 U/L (ref 39–117)
BUN: 7 mg/dL (ref 6–23)
Calcium: 9.4 mg/dL (ref 8.4–10.5)
Potassium: 3.6 mEq/L (ref 3.5–5.1)
Sodium: 137 mEq/L (ref 135–145)
Total Protein: 7.2 g/dL (ref 6.0–8.3)

## 2011-08-29 LAB — CBC WITH DIFFERENTIAL/PLATELET
Basophils Relative: 1 % (ref 0–1)
Eosinophils Absolute: 0.1 10*3/uL (ref 0.0–0.7)
Eosinophils Relative: 1 % (ref 0–5)
MCH: 29.6 pg (ref 26.0–34.0)
MCHC: 32.1 g/dL (ref 30.0–36.0)
Neutrophils Relative %: 77 % (ref 43–77)
Platelets: 287 10*3/uL (ref 150–400)

## 2011-08-29 LAB — RAPID URINE DRUG SCREEN, HOSP PERFORMED
Barbiturates: NOT DETECTED
Benzodiazepines: NOT DETECTED
Cocaine: NOT DETECTED
Opiates: NOT DETECTED

## 2011-08-29 LAB — ETHANOL: Alcohol, Ethyl (B): <11 mg/dL (ref 0–11)

## 2011-08-29 MED ORDER — ARIPIPRAZOLE 5 MG PO TABS: 5.0000 mg | ORAL_TABLET | Freq: Every day | ORAL | Status: DC

## 2011-08-29 MED ORDER — NICOTINE 21 MG/24HR TD PT24: 21.0000 mg | MEDICATED_PATCH | Freq: Every day | TRANSDERMAL | Status: DC

## 2011-08-29 MED ORDER — CARBAMAZEPINE 200 MG PO TABS
800.0000 mg | ORAL_TABLET | Freq: Every day | ORAL | Status: DC
  Administered 2011-08-29: 800 mg via ORAL
  Filled 2011-08-29: qty 4

## 2011-08-29 MED ORDER — LEVOTHYROXINE SODIUM 137 MCG PO TABS: 137.0000 ug | ORAL_TABLET | Freq: Every day | ORAL | Status: DC

## 2011-08-29 MED ORDER — LAMOTRIGINE 150 MG PO TABS: 150.0000 mg | ORAL_TABLET | Freq: Every day | ORAL | Status: DC

## 2011-08-29 MED ORDER — IBUPROFEN 600 MG PO TABS: 600.0000 mg | ORAL_TABLET | Freq: Three times a day (TID) | ORAL | Status: DC | PRN

## 2011-08-29 MED ORDER — SERTRALINE HCL 50 MG PO TABS: 25.0000 mg | ORAL_TABLET | Freq: Every day | ORAL | Status: DC

## 2011-08-29 MED ORDER — ZOLPIDEM TARTRATE 5 MG PO TABS: 5.0000 mg | ORAL_TABLET | Freq: Every evening | ORAL | Status: DC | PRN

## 2011-08-29 MED ORDER — ONDANSETRON HCL 4 MG PO TABS: 4.0000 mg | ORAL_TABLET | Freq: Three times a day (TID) | ORAL | Status: DC | PRN

## 2011-08-29 MED ORDER — ACETAMINOPHEN 325 MG PO TABS: 650.0000 mg | ORAL_TABLET | ORAL | Status: DC | PRN

## 2011-08-29 MED ORDER — ARIPIPRAZOLE 10 MG PO TABS: 10.0000 mg | ORAL_TABLET | Freq: Every day | ORAL | Status: DC

## 2011-08-29 MED ORDER — ALUM & MAG HYDROXIDE-SIMETH 200-200-20 MG/5ML PO SUSP: 30.0000 mL | ORAL | Status: DC | PRN

## 2011-08-29 NOTE — ED Notes (Signed)
Attempted to call report to Psych ED, no RN available, will try back ASAP.

## 2011-08-29 NOTE — Discharge Instructions (Signed)
You need to increase your abilify to 15mg  per day for the next day and then take 20mg  per day after that.  Depression You have signs of depression. This is a common problem. It can occur at any age. It is often hard to recognize. People can suffer from depression and still have moments of enjoyment. Depression interferes with your basic ability to function in life. It upsets your relationships, sleep, eating, and work habits. CAUSES  Depression is believed to be caused by an imbalance in brain chemicals. It may be triggered by an unpleasant event. Relationship crises, a death in the family, financial worries, retirement, or other stressors are normal causes of depression. Depression may also start for no known reason. Other factors that may play a part include medical illnesses, some medicines, genetics, and alcohol or drug abuse. SYMPTOMS   Feeling unhappy or worthless.   Long-lasting (chronic) tiredness or worn-out feeling.   Self-destructive thoughts and actions.   Not being able to sleep or sleeping too much.   Eating more than usual or not eating at all.   Headaches or feeling anxious.   Trouble concentrating or making decisions.   Unexplained physical problems and substance abuse.  TREATMENT  Depression usually gets better with treatment. This can include:  Antidepressant medicines. It can take weeks before the proper dose is achieved and benefits are reached.   Talking with a therapist, clergyperson, counselor, or friend. These people can help you gain insight into your problem and regain control of your life.   Eating a good diet.   Getting regular physical exercise, such as walking for 30 minutes every day.   Not abusing alcohol or drugs.  Treating depression often takes 6 months or longer. This length of treatment is needed to keep symptoms from returning. Call your caregiver and arrange for follow-up care as suggested. SEEK IMMEDIATE MEDICAL CARE IF:   You start to  have thoughts of hurting yourself or others.   Call your local emergency services (911 in U.S[place]   Go to your local medical emergency department.   Call the National Suicide Prevention Lifeline: 1-800-273-TALK 6461388774).  Document Released: 02/16/2005 Document Revised: 02/05/2011 Document Reviewed: 07/19/2009 [facility] Patient Information 2012 [place], [place].  RESOURCE GUIDE  Chronic Pain Problems: Contact [facility]  [phone] Patients need to be referred by their primary care doctor.  Insufficient Money for Medicine: Contact United Way:  call "211" or [facility] 6048834958.  No Primary Care Doctor: - Call [facility] Connect  [phone] - can help you locate a primary care doctor that  accepts your insurance, provides certain services, etc. - Physician Referral [phone]  Agencies that provide inexpensive medical care: - [facility]  [phone] - [facility]  [phone] - [facility]  [phone] [facility]  [phone] - Planned Parenthood  [phone] [facility]  [phone]  Medicaid-accepting [facility] Providers: - [facility]- [address]  [phone], Mon-Fri 9am-7pm, Sat 9am-1pm - [facility]- [address] [place], [address] [place]  [phone] - [facility]- [address], [address] [place]  [phone] [facility]- [address]  [phone] - Renaye Rakers- [address] [place], [address], [phone]  Only accepts [place] patients after they have their name  applied to their card  Self Pay (no insurance) in [facility]: - Sickle Cell Patients: Dr  Willey Blade, [facility]  [address] [place], [phone] - [facility]gent Care- [address] [place]  [phone]       Patrcia Dolly [facility] [place]- [address]       -      [facility]- see information above (Speak to [employer] if you do not have insurance)       -  [facility]- [address] [place], [phone]       -  [facility] [place]- [address],  [phone]       -  [facility]- [address] [phone]       -  Dr Julio Sicks-  [address], [address], [place], [phone]       -  [facility]- [address] [phone]       -  [facility]- [address] [phone], also [address] [phone]       -    [facility]- [address] [place], [phone], 1st & 3rd Saturday   every month, 10am-1pm  1) Find a Doctor and Pay Out of Pocket Although you won't have to find out who is covered by your insurance plan, it is a good idea to ask around and get recommendations. You will then need to call the office and see if the doctor you have chosen will accept you as a new patient and what types of options they offer for patients who are self-pay. Some doctors offer discounts or will set up payment plans for their patients who do not have insurance, but you will need to ask so you aren't surprised when you get to your appointment.  2) Contact Your Local Health Department Not all health departments have doctors that can see patients for sick visits, but many do, so it is worth a call to see if yours does. If you don't know where your local health department is, you can check in your phone book. The CDC also has a tool to help you locate your state's health department, and many state websites also have listings of all of their local health departments.  3) Find a Walk-in Clinic If your illness is not likely to be very severe or complicated, you may want to try a walk in clinic. These are popping up all over the country in pharmacies, drugstores, and shopping centers. They're usually staffed by nurse practitioners or physician assistants that have been trained to treat common illnesses and complaints.  They're usually fairly quick and inexpensive. However, if you have serious medical issues or chronic medical problems, these are probably not your best option  STD Testing - [facility] Department of [facility], STD Clinic, [address] [place], phone [phone] or [phone].  Monday - Friday, call for an appointment. [facility] Department of [employer], STD Clinic, [place] [address], [place], phone [phone] or [phone].  Monday - Friday, call for an appointment.  Abuse/Neglect: [facility] Child Abuse Hotline (480) 787-7469 [facility] Child Abuse Hotline 614-762-1551 (After Hours)  Emergency Shelter:  [facility] 562 146 4458  Maternity Homes: - [facility] at [facility] [place] [facility] [facility] 718-147-1486 - [facility] [phone]  MRSA Hotline #:   (517)105-9745  [facility] of [place]  [facility] Dept. [address]                 [address]         [address] [place] [address]  Blondell Reveal Phone:  [phone]                                  Phone:  [phone]                   Phone:  [phone]  [facility] Mental Health, [phone] - [phone]       -     [facility] in [place], [address],                                  ([phone], [facility] Child Abuse Hotline 873 858 8122 or 902-252-1754 (After Hours)   Behavioral Health Services  Substance Abuse Resources: - Alcohol and Drug Services  [phone] - [facility] 2138690220 - [facility] [place] [phone] Floydene Flock [phone] - Residential & Outpatient Substance Abuse Program  [phone]  Psychological Services: [facility]  7037149749 Services  [phone] - [facility], [phone] [place]. [address] [place], ACCESS LINE: (551)[phone] or [phone], [employer].za  Dental Assistance  If unable to pay or uninsured, contact:  [facility] or [facility]. to become qualified for the adult dental clinic.  Patients with Medicaid: [facility] [address] [facility] [phone] [address], [phone] [address] [phone]  If unable to pay, or uninsured, contact [facility] 807-383-4014) or [facility] Department [phone] in [place], [phone] in [facility]) to become qualified for the adult dental clinic  Other Low-Cost Community Dental Services: - [facility]- [address] [place], [place], [place], [address], [phone], [address]. [address], 2nd and 4th Thursday of the month at 6:30am.  10 clients each day by appointment, can sometimes see walk-in patients if someone does not show for an appointment. [facility]- [address] [place], [place], [address], [phone] - [facility]- [address], [place], [place], [address], [phone] - [facility]- [phone] [facility] Department- [phone] [facility]- [phone]

## 2011-08-29 NOTE — ED Notes (Signed)
Pt assisted to room #42 accompanied by this nurse with 1 bag and 1 bag of personal belongings which were locked in locker #42, Vance Gather, RN aware. Pt remains stable, calm and cooperative at time at transfer.
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
## 2011-08-29 NOTE — ED Notes (Signed)
Telepsych in progress, Renaldo Fiddler present in room with pt.
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
## 2011-08-29 NOTE — ED Provider Notes (Signed)
Medical screening examination/treatment/procedure(s) were performed by non-physician practitioner and as supervising physician I was immediately available for consultation/collaboration.  Celene Kras, MD 08/29/11 [phone]

## 2011-08-29 NOTE — ED Notes (Signed)
Pt is cleared for discharge by telepsych and EDP/ Pts VSS, denies pain or distress. Pt is pleasant and cooperative. Pt verbalized teach back understanding of discharge instructions and follow-up care. Pt will transport self home, car is here at hospital. NAD noted.

## 2011-10-13 ENCOUNTER — Emergency Department (HOSPITAL_BASED_OUTPATIENT_CLINIC_OR_DEPARTMENT_OTHER)
Admission: EM | Admit: 2011-10-13 | Discharge: 2011-10-13 | Disposition: A | Discharge status: Home / Self Care | Payer: Medicaid Other | Attending: Emergency Medicine | Admitting: Emergency Medicine

## 2011-10-13 ENCOUNTER — Emergency Department (HOSPITAL_BASED_OUTPATIENT_CLINIC_OR_DEPARTMENT_OTHER): Payer: Medicaid Other

## 2011-10-13 ENCOUNTER — Encounter (HOSPITAL_BASED_OUTPATIENT_CLINIC_OR_DEPARTMENT_OTHER): Payer: Self-pay | Admitting: Emergency Medicine

## 2011-10-13 DIAGNOSIS — E079 Disorder of thyroid, unspecified: Secondary | ICD-10-CM | POA: Insufficient documentation

## 2011-10-13 DIAGNOSIS — R0989 Other specified symptoms and signs involving the circulatory and respiratory systems: Secondary | ICD-10-CM | POA: Insufficient documentation

## 2011-10-13 DIAGNOSIS — F319 Bipolar disorder, unspecified: Secondary | ICD-10-CM | POA: Insufficient documentation

## 2011-10-13 DIAGNOSIS — Z9089 Acquired absence of other organs: Secondary | ICD-10-CM | POA: Insufficient documentation

## 2011-10-13 DIAGNOSIS — R0609 Other forms of dyspnea: Secondary | ICD-10-CM | POA: Insufficient documentation

## 2011-10-13 DIAGNOSIS — R0600 Dyspnea, unspecified: Secondary | ICD-10-CM

## 2011-10-13 LAB — BASIC METABOLIC PANEL
CO2: 26 mEq/L (ref 19–32)
Calcium: 10.5 mg/dL (ref 8.4–10.5)
Creatinine, Ser: 0.9 mg/dL (ref 0.50–1.10)

## 2011-10-13 LAB — CBC WITH DIFFERENTIAL/PLATELET
Basophils Absolute: 0 10*3/uL (ref 0.0–0.1)
Basophils Relative: 0 % (ref 0–1)
Eosinophils Absolute: 0.2 10*3/uL (ref 0.0–0.7)
Eosinophils Relative: 1 % (ref 0–5)
HCT: 38.3 % (ref 36.0–46.0)
MCH: 30 pg (ref 26.0–34.0)
MCHC: 32.4 g/dL (ref 30.0–36.0)
MCV: 92.7 fL (ref 78.0–100.0)
Monocytes Absolute: 0.8 10*3/uL (ref 0.1–1.0)
RDW: 13.1 % (ref 11.5–15.5)

## 2011-10-13 NOTE — ED Notes (Signed)
Pt c/o shortness of breath that started about a [place] ago [place] the beach. Pt states she came in here a couple weeks ago and had chest x-rays and was sent home. Pt states today is worse than a couple weeks ago and felt she needed to come in again today. Pt states its a lot better when she stand. Pt states she sleep with 5 pillows under head and takes medicine to sleep.

## 2011-10-13 NOTE — ED Notes (Signed)
Patient transported to X-ray 

## 2011-10-13 NOTE — ED Provider Notes (Signed)
History     CSN: [id number]  Arrival date & time 10/13/11  1445   First MD Initiated Contact with Patient 10/13/11 1505      Chief Complaint  Patient presents with  . Shortness of Breath    (Consider location/radiation/quality/duration/timing/severity/associated sxs/prior treatment) HPI Pt with history of bipolar and hypothyroid (secondary to thyroidectomy) reports she has been feeling SOB since yesterday afternoon. She states she feels like she is 'suffocating' when she lies down, improved with standing up. States she had similar symptoms about 2 month ago which lasted for a few days before she sought care here. Symptoms had resolved prior to ED evaluation which was normal. She states she felt better after smoking marijuana last night. Denies any CP or leg swelling. No recent travel.   Past Medical History  Diagnosis Date  . Thyroid disease   . Bipolar 1 disorder   . Cancer     Past Surgical History  Procedure Date  . Thyroidectomy   . Appendectomy   . Cesarean section   . Tubal ligation   . Uterine ablation     05/2010    History reviewed. No pertinent family history.  History  Substance Use Topics  . Smoking status: Never Smoker   . Smokeless tobacco: Never Used  . Alcohol Use: Yes     every couple days    OB History    Grav Para Term Preterm Abortions TAB SAB Ect Mult Living                  Review of Systems All other systems reviewed and are negative except as noted in HPI.   Allergies  Morphine and related  Home Medications   Current Outpatient Rx  Name Route Sig Dispense Refill  . ABILIFY PO Oral Take 5 mg by mouth daily.     . ARIPIPRAZOLE 10 MG PO TABS Oral Take 1 tablet (10 mg total) by mouth daily. 30 tablet 0  . TEGRETOL PO Oral Take 800 mg by mouth at bedtime.     Marland Kitchen LAMICTAL PO Oral Take 150 mg by mouth daily.     Marland Kitchen LEVOTHYROXINE SODIUM 137 MCG PO TABS Oral Take 137 mcg by mouth daily.    Marland Kitchen LORAZEPAM 1 MG PO TABS Oral Take 1 mg by mouth 2  (two) times daily.    Marland Kitchen ZOLOFT PO Oral Take 25 mg by mouth daily.       BP 140/87  Pulse 90  Temp 98.1 F (36.7 C) (Oral)  Resp 18  SpO2 100%  Physical Exam  Nursing note and vitals reviewed. Constitutional: She is oriented to person, place, and time. She appears well-developed and well-nourished.  HENT:  Head: Normocephalic and atraumatic.  Eyes: EOM are normal. Pupils are equal, round, and reactive to light.  Neck: Normal range of motion. Neck supple.  Cardiovascular: Normal rate, normal heart sounds and intact distal pulses.   Pulmonary/Chest: Effort normal and breath sounds normal.  Abdominal: Bowel sounds are normal. She exhibits no distension. There is no tenderness.  Musculoskeletal: Normal range of motion. She exhibits no edema and no tenderness.  Neurological: She is alert and oriented to person, place, and time. She has normal strength. No cranial nerve deficit or sensory deficit.  Skin: Skin is warm and dry. No rash noted.  Psychiatric: She has a normal mood and affect.    ED Course  Procedures (including critical care time)  Labs Reviewed  CBC WITH DIFFERENTIAL - Abnormal; Notable for  the following:    WBC 11.7 (*)     Neutrophils Relative 79 (*)     Neutro Abs 9.2 (*)     All other components within normal limits  BASIC METABOLIC PANEL - Abnormal; Notable for the following:    Glucose, Bld 104 (*)     GFR calc non Af Amer 76 (*)     GFR calc Af Amer 88 (*)     All other components within normal limits   Dg Chest 2 View  10/13/2011  *RADIOLOGY REPORT*  Clinical Data: Short of breath for 1 month.  CHEST - 2 VIEW  Comparison: 08/17/2011  Findings: The heart, mediastinum and hila are within normal limits. The lungs are clear.  No pleural effusion or pneumothorax.  Bony structures and surrounding soft tissues are unremarkable.  IMPRESSION: Normal chest radiographs.  Original Report Authenticated By: Domenic Moras, M.D.     No diagnosis found.    MDM  Exam  today is normal. Will recheck CXR, CBC and BMP to compare to previous visit. No concern for ACS or PE.    Date: 10/13/2011  Rate: 81  Rhythm: normal sinus rhythm  QRS Axis: left  Intervals: normal  ST/T Wave abnormalities: normal  Conduction Disutrbances:none  Narrative Interpretation:   Old EKG Reviewed: unchanged   4:23 PM No change in symptoms. Pt has not had any hypoxia or tachycardia while in the ED. CXR as above is normal. She has a mild leukocytosis of unclear significance, but no anemia or renal insufficiency to account for her dyspnea. Advised to followup with PCP for further investigation if symptoms persist.      Leonette Most B. Bernette Mayers, MD 10/13/11 [phone]

## 2012-11-04 DIAGNOSIS — F319 Bipolar disorder, unspecified: Secondary | ICD-10-CM | POA: Insufficient documentation

## 2012-11-04 DIAGNOSIS — E039 Hypothyroidism, unspecified: Secondary | ICD-10-CM | POA: Insufficient documentation

## 2013-05-10 DIAGNOSIS — G43109 Migraine with aura, not intractable, without status migrainosus: Secondary | ICD-10-CM | POA: Insufficient documentation

## 2013-12-03 DIAGNOSIS — Z923 Personal history of irradiation: Secondary | ICD-10-CM | POA: Insufficient documentation

## 2015-03-13 DIAGNOSIS — E559 Vitamin D deficiency, unspecified: Secondary | ICD-10-CM | POA: Insufficient documentation

## 2015-08-06 DIAGNOSIS — G4733 Obstructive sleep apnea (adult) (pediatric): Secondary | ICD-10-CM | POA: Insufficient documentation

## 2015-09-24 DIAGNOSIS — R7303 Prediabetes: Secondary | ICD-10-CM | POA: Insufficient documentation

## 2015-12-23 NOTE — Patient Instructions (Addendum)
Your procedure is scheduled on:  Friday, Nov. 3, 2017  Enter through the [employer] [facility] at:  11:00 AM  Pick up the phone at the desk and dial [phone].  Call this number if you have problems the morning of surgery: [phone].  Remember: Do NOT eat food:  After Midnight Thursday, Nov. 2, 2017  Do NOT drink clear liquids after:  8:30 AM day of surgery  Take these medicines the morning of surgery with a SIP OF WATER:  Medroxyprogesterone, Levothyroxine, Lithium, Xanax if needed  Stop ALL herbal medications at this time   Do NOT wear jewelry (body piercing), metal hair clips/bobby pins, make-up, or nail polish. Do NOT wear lotions, powders, or perfumes.  You may wear deodorant. Do NOT shave for 48 hours prior to surgery. Do NOT bring valuables to the hospital. Contacts, dentures, or bridgework may not be worn into surgery.  Have a responsible adult drive you home and stay with you for 24 hours after your procedure

## 2015-12-24 ENCOUNTER — Encounter (HOSPITAL_COMMUNITY): Payer: Self-pay

## 2015-12-24 ENCOUNTER — Encounter (HOSPITAL_COMMUNITY)
Admission: RE | Admit: 2015-12-24 | Discharge: 2015-12-24 | Disposition: A | Discharge status: Home / Self Care | Payer: Medicare Other | Source: Ambulatory Visit | Attending: Obstetrics and Gynecology | Admitting: Obstetrics and Gynecology

## 2015-12-24 DIAGNOSIS — Z01812 Encounter for preprocedural laboratory examination: Secondary | ICD-10-CM | POA: Diagnosis present

## 2015-12-24 DIAGNOSIS — N393 Stress incontinence (female) (male): Secondary | ICD-10-CM | POA: Diagnosis not present

## 2015-12-24 HISTORY — DX: Dyspnea, unspecified: R06.00

## 2015-12-24 HISTORY — DX: Nausea with vomiting, unspecified: R11.2

## 2015-12-24 HISTORY — DX: Headache: R51

## 2015-12-24 HISTORY — DX: Sleep apnea, unspecified: G47.30

## 2015-12-24 HISTORY — DX: Hypothyroidism, unspecified: E03.9

## 2015-12-24 HISTORY — DX: Other amnesia: R41.3

## 2015-12-24 HISTORY — DX: Gastro-esophageal reflux disease without esophagitis: K21.9

## 2015-12-24 HISTORY — DX: Other specified postprocedural states: Z98.890

## 2015-12-24 HISTORY — DX: Headache, unspecified: R51.9

## 2015-12-24 LAB — CBC
HEMATOCRIT: 39.3 % (ref 36.0–46.0)
HEMOGLOBIN: 12.5 g/dL (ref 12.0–15.0)
MCH: 29.3 pg (ref 26.0–34.0)
MCHC: 31.8 g/dL (ref 30.0–36.0)
MCV: 92.3 fL (ref 78.0–100.0)
PLATELETS: 333 10*3/uL (ref 150–400)
RBC: 4.26 MIL/uL (ref 3.87–5.11)
RDW: 13.1 % (ref 11.5–15.5)
WBC: 10.1 10*3/uL (ref 4.0–10.5)

## 2016-01-02 NOTE — H&P (Signed)
Yolanda Jordan is an 50 y.o. female. She was seen for annual exam in September. Menopausal, no bleeding, no pain. Doing well with estradiol and Provera HRT. Normal Pap last year, she reports normal mammogram in July. Increased SUI, some urgency with leak also.  Pertinent Gynecological History: Last mammogram: normal Date: 08/2015 per patient report Last pap: normal Date: 2016 OB History: G5, P4103  SVD x 3,  c-section x 2, one IUFD  Menstrual History: No LMP recorded. Patient has had an ablation.    Past Medical History:  Diagnosis Date  . Bipolar 1 disorder ([place])   . Cancer ([place]) 2000   thyroid cancer  . Dyspnea    panic attacks, high humidity  . GERD (gastroesophageal reflux disease)   . Headache    Migraines  . Hypothyroidism   . Memory loss   . PONV (postoperative nausea and vomiting)   . Sleep apnea   . Thyroid disease     Past Surgical History:  Procedure Laterality Date  . APPENDECTOMY    . CESAREAN SECTION    . HEMORROIDECTOMY     age 43  . THYROIDECTOMY    . TUBAL LIGATION    . uterine ablation     05/2010  . WISDOM TOOTH EXTRACTION      No family history on file.  Social History:  reports that she has never smoked. She has never used smokeless tobacco. She reports that she drinks alcohol. She reports that she uses drugs, including Marijuana.  Allergies:  Allergies  Allergen Reactions  . Morphine And Related Itching    No prescriptions prior to admission.    Review of Systems  Respiratory: Negative.   Cardiovascular: Negative.   Gastrointestinal: Negative.   Genitourinary: Positive for urgency.    There were no vitals taken for this visit. Physical Exam  Constitutional: She appears well-developed and well-nourished.  Neck: Neck supple. No thyromegaly present.  Cardiovascular: Normal rate, regular rhythm and normal heart sounds.   No murmur heard. Respiratory: Effort normal and breath sounds normal. No respiratory distress. She has no wheezes.   GI: Soft. She exhibits no distension and no mass. There is no tenderness.  Transverse scar  Genitourinary: Uterus normal.  Genitourinary Comments: Minimal cystocele, no rectocele, hypermobile urethra, Gr II perineal defect No adnexal mass    No results found for this or any previous visit (from the past 24 hour(s)).  No results found.  Assessment/Plan: SUI with increasing symptoms, as well as perineal defect.  Discussed medical and surgical options, discussed TVT procedure and risks, including specific risks associated with mesh, and chances of relieving symptoms.  Will admit for solyx sling and perineoplasty.  Travius Crochet D 01/02/2016, 9:27 PM

## 2016-01-03 ENCOUNTER — Encounter (HOSPITAL_COMMUNITY)
Admission: RE | Disposition: A | Discharge status: Home / Self Care | Payer: Self-pay | Source: Ambulatory Visit | Attending: Obstetrics and Gynecology

## 2016-01-03 ENCOUNTER — Ambulatory Visit (HOSPITAL_COMMUNITY): Payer: Medicare Other | Admitting: Anesthesiology

## 2016-01-03 ENCOUNTER — Ambulatory Visit (HOSPITAL_COMMUNITY)
Admission: RE | Admit: 2016-01-03 | Discharge: 2016-01-03 | Disposition: A | Discharge status: Home / Self Care | Payer: Medicare Other | Source: Ambulatory Visit | Attending: Obstetrics and Gynecology | Admitting: Obstetrics and Gynecology

## 2016-01-03 DIAGNOSIS — E890 Postprocedural hypothyroidism: Secondary | ICD-10-CM | POA: Diagnosis not present

## 2016-01-03 DIAGNOSIS — F410 Panic disorder [episodic paroxysmal anxiety] without agoraphobia: Secondary | ICD-10-CM | POA: Insufficient documentation

## 2016-01-03 DIAGNOSIS — N3641 Hypermobility of urethra: Secondary | ICD-10-CM | POA: Insufficient documentation

## 2016-01-03 DIAGNOSIS — G4730 Sleep apnea, unspecified: Secondary | ICD-10-CM | POA: Insufficient documentation

## 2016-01-03 DIAGNOSIS — N393 Stress incontinence (female) (male): Secondary | ICD-10-CM | POA: Insufficient documentation

## 2016-01-03 DIAGNOSIS — Z885 Allergy status to narcotic agent status: Secondary | ICD-10-CM | POA: Diagnosis not present

## 2016-01-03 DIAGNOSIS — F319 Bipolar disorder, unspecified: Secondary | ICD-10-CM | POA: Insufficient documentation

## 2016-01-03 DIAGNOSIS — N9089 Other specified noninflammatory disorders of vulva and perineum: Secondary | ICD-10-CM | POA: Insufficient documentation

## 2016-01-03 DIAGNOSIS — F1290 Cannabis use, unspecified, uncomplicated: Secondary | ICD-10-CM | POA: Diagnosis not present

## 2016-01-03 DIAGNOSIS — Z85850 Personal history of malignant neoplasm of thyroid: Secondary | ICD-10-CM | POA: Diagnosis not present

## 2016-01-03 DIAGNOSIS — K219 Gastro-esophageal reflux disease without esophagitis: Secondary | ICD-10-CM | POA: Insufficient documentation

## 2016-01-03 HISTORY — PX: BLADDER SUSPENSION: SHX72

## 2016-01-03 HISTORY — PX: PERINEOPLASTY: SHX2218

## 2016-01-03 HISTORY — PX: CYSTOSCOPY: SHX5120

## 2016-01-03 SURGERY — URETHROPEXY, USING TRANSVAGINAL TAPE
Anesthesia: General | Site: Vagina

## 2016-01-03 MED ORDER — DEXAMETHASONE SODIUM PHOSPHATE 4 MG/ML IJ SOLN
INTRAMUSCULAR | Status: AC
  Filled 2016-01-03: qty 1

## 2016-01-03 MED ORDER — HYDROMORPHONE HCL 1 MG/ML IJ SOLN
0.2500 mg | INTRAMUSCULAR | Status: DC | PRN
  Administered 2016-01-03 (×2): 0.25 mg via INTRAVENOUS

## 2016-01-03 MED ORDER — STERILE WATER FOR IRRIGATION IR SOLN
Status: DC | PRN
  Administered 2016-01-03: 100 mL via INTRAVESICAL

## 2016-01-03 MED ORDER — HYDROCODONE-ACETAMINOPHEN 5-325 MG PO TABS: 1.0000 | ORAL_TABLET | Freq: Four times a day (QID) | ORAL | 0 refills | Status: AC | PRN

## 2016-01-03 MED ORDER — LIDOCAINE-EPINEPHRINE 1 %-1:100000 IJ SOLN
INTRAMUSCULAR | Status: AC
  Filled 2016-01-03: qty 1

## 2016-01-03 MED ORDER — KETOROLAC TROMETHAMINE 30 MG/ML IJ SOLN
INTRAMUSCULAR | Status: DC | PRN
Start: 2016-01-03 — End: 2016-01-03
  Administered 2016-01-03: 30 mg via INTRAVENOUS

## 2016-01-03 MED ORDER — FENTANYL CITRATE (PF) 100 MCG/2ML IJ SOLN
INTRAMUSCULAR | Status: AC
  Filled 2016-01-03: qty 2

## 2016-01-03 MED ORDER — BUPIVACAINE-EPINEPHRINE 0.5% -1:200000 IJ SOLN
INTRAMUSCULAR | Status: DC | PRN
  Administered 2016-01-03: 10 mL

## 2016-01-03 MED ORDER — BUPIVACAINE-EPINEPHRINE (PF) 0.5% -1:200000 IJ SOLN
INTRAMUSCULAR | Status: AC
  Filled 2016-01-03: qty 60

## 2016-01-03 MED ORDER — SCOPOLAMINE 1 MG/3DAYS TD PT72
MEDICATED_PATCH | TRANSDERMAL | Status: AC
  Filled 2016-01-03: qty 1

## 2016-01-03 MED ORDER — ONDANSETRON HCL 4 MG/2ML IJ SOLN
INTRAMUSCULAR | Status: DC | PRN
  Administered 2016-01-03: 4 mg via INTRAVENOUS

## 2016-01-03 MED ORDER — LACTATED RINGERS IV SOLN
INTRAVENOUS | Status: DC
  Administered 2016-01-03 (×3): via INTRAVENOUS

## 2016-01-03 MED ORDER — FENTANYL CITRATE (PF) 100 MCG/2ML IJ SOLN
INTRAMUSCULAR | Status: DC | PRN
  Administered 2016-01-03: 100 ug via INTRAVENOUS

## 2016-01-03 MED ORDER — LIDOCAINE HCL (CARDIAC) 20 MG/ML IV SOLN
INTRAVENOUS | Status: DC | PRN
  Administered 2016-01-03: 100 mg via INTRAVENOUS

## 2016-01-03 MED ORDER — ONDANSETRON HCL 4 MG/2ML IJ SOLN
INTRAMUSCULAR | Status: AC
  Filled 2016-01-03: qty 2

## 2016-01-03 MED ORDER — SUGAMMADEX SODIUM 200 MG/2ML IV SOLN
INTRAVENOUS | Status: DC | PRN
  Administered 2016-01-03: 200 mg via INTRAVENOUS

## 2016-01-03 MED ORDER — LIDOCAINE HCL 1 % IJ SOLN
INTRAMUSCULAR | Status: DC | PRN
Start: 2016-01-03 — End: 2016-01-03
  Administered 2016-01-03: 10 mL

## 2016-01-03 MED ORDER — ROCURONIUM BROMIDE 100 MG/10ML IV SOLN
INTRAVENOUS | Status: AC
  Filled 2016-01-03: qty 1

## 2016-01-03 MED ORDER — ESTRADIOL 0.1 MG/GM VA CREA
TOPICAL_CREAM | VAGINAL | Status: AC
  Filled 2016-01-03: qty 42.5

## 2016-01-03 MED ORDER — BACITRACIN-NEOMYCIN-POLYMYXIN 400-5-5000 EX OINT
TOPICAL_OINTMENT | CUTANEOUS | Status: AC
  Filled 2016-01-03: qty 1

## 2016-01-03 MED ORDER — ESTRADIOL 0.1 MG/GM VA CREA
TOPICAL_CREAM | VAGINAL | Status: DC | PRN
  Administered 2016-01-03: 1 via VAGINAL

## 2016-01-03 MED ORDER — PROMETHAZINE HCL 25 MG/ML IJ SOLN
6.2500 mg | INTRAMUSCULAR | Status: DC | PRN
Start: 2016-01-03 — End: 2016-01-03

## 2016-01-03 MED ORDER — LIDOCAINE HCL 1 % IJ SOLN
INTRAMUSCULAR | Status: AC
  Filled 2016-01-03: qty 20

## 2016-01-03 MED ORDER — PROPOFOL 10 MG/ML IV BOLUS
INTRAVENOUS | Status: DC | PRN
  Administered 2016-01-03: 200 mg via INTRAVENOUS

## 2016-01-03 MED ORDER — ROCURONIUM BROMIDE 100 MG/10ML IV SOLN
INTRAVENOUS | Status: DC | PRN
  Administered 2016-01-03: 50 mg via INTRAVENOUS

## 2016-01-03 MED ORDER — PROPOFOL 10 MG/ML IV BOLUS
INTRAVENOUS | Status: AC
  Filled 2016-01-03: qty 20

## 2016-01-03 MED ORDER — DEXAMETHASONE SODIUM PHOSPHATE 10 MG/ML IJ SOLN
INTRAMUSCULAR | Status: DC | PRN
  Administered 2016-01-03: 4 mg via INTRAVENOUS

## 2016-01-03 MED ORDER — HYDROMORPHONE HCL 1 MG/ML IJ SOLN
INTRAMUSCULAR | Status: AC
  Administered 2016-01-03: 0.25 mg via INTRAVENOUS
  Filled 2016-01-03: qty 1

## 2016-01-03 MED ORDER — MIDAZOLAM HCL 2 MG/2ML IJ SOLN
INTRAMUSCULAR | Status: DC | PRN
  Administered 2016-01-03: 2 mg via INTRAVENOUS

## 2016-01-03 MED ORDER — SCOPOLAMINE 1 MG/3DAYS TD PT72
1.0000 | MEDICATED_PATCH | Freq: Once | TRANSDERMAL | Status: DC
  Administered 2016-01-03: 1.5 mg via TRANSDERMAL

## 2016-01-03 MED ORDER — LIDOCAINE HCL (CARDIAC) 20 MG/ML IV SOLN
INTRAVENOUS | Status: AC
  Filled 2016-01-03: qty 5

## 2016-01-03 MED ORDER — MIDAZOLAM HCL 2 MG/2ML IJ SOLN
INTRAMUSCULAR | Status: AC
  Filled 2016-01-03: qty 2

## 2016-01-03 MED ORDER — CEFAZOLIN SODIUM-DEXTROSE 2-4 GM/100ML-% IV SOLN
2.0000 g | INTRAVENOUS | Status: AC
  Administered 2016-01-03: 2 g via INTRAVENOUS

## 2016-01-03 SURGICAL SUPPLY — 37 items
BLADE SURG 11 STRL SS (BLADE) ×2 IMPLANT
BLADE SURG 15 STRL LF C SS BP (BLADE) ×3 IMPLANT
BLADE SURG 15 STRL SS (BLADE) ×5
CANISTER SUCT 3000ML (MISCELLANEOUS) ×5 IMPLANT
CATH ROBINSON RED A/P 16FR (CATHETERS) ×2 IMPLANT
CLOTH BEACON ORANGE TIMEOUT ST (SAFETY) ×5 IMPLANT
COUNTER NEEDLE 1200 MAGNETIC (NEEDLE) ×5 IMPLANT
DECANTER SPIKE VIAL GLASS SM (MISCELLANEOUS) IMPLANT
ELECT REM PT RETURN 9FT ADLT (ELECTROSURGICAL) ×5
ELECTRODE REM PT RTRN 9FT ADLT (ELECTROSURGICAL) ×3 IMPLANT
GAUZE PACKING 2X5 YD STRL (GAUZE/BANDAGES/DRESSINGS) ×2 IMPLANT
GLOVE BIO SURGEON STRL SZ8 (GLOVE) ×5 IMPLANT
GLOVE BIOGEL PI IND STRL 7.0 (GLOVE) ×3 IMPLANT
GLOVE BIOGEL PI INDICATOR 7.0 (GLOVE) ×4
GLOVE ORTHO TXT STRL SZ7.5 (GLOVE) ×5 IMPLANT
GOWN STRL REUS W/TWL LRG LVL3 (GOWN DISPOSABLE) ×12 IMPLANT
LIQUID BAND (GAUZE/BANDAGES/DRESSINGS) IMPLANT
NEEDLE HYPO 22GX1.5 SAFETY (NEEDLE) ×5 IMPLANT
NS IRRIG 1000ML POUR BTL (IV SOLUTION) ×5 IMPLANT
PACK VAGINAL MINOR WOMEN LF (CUSTOM PROCEDURE TRAY) ×3 IMPLANT
PACK VAGINAL WOMENS (CUSTOM PROCEDURE TRAY) ×5 IMPLANT
PAD OB MATERNITY 4.3X12.25 (PERSONAL CARE ITEMS) ×5 IMPLANT
PENCIL BUTTON HOLSTER BLD 10FT (ELECTRODE) ×2 IMPLANT
SET CYSTO W/LG BORE CLAMP LF (SET/KITS/TRAYS/PACK) ×2 IMPLANT
SLING SOLYX SYSTEM SIS EA (Sling) ×2 IMPLANT
SUT VIC AB 2-0 CT1 (SUTURE) ×20 IMPLANT
SUT VIC AB 2-0 CT2 27 (SUTURE) ×2 IMPLANT
SUT VIC AB 3-0 CT1 27 (SUTURE) ×5
SUT VIC AB 3-0 CT1 TAPERPNT 27 (SUTURE) IMPLANT
SUT VIC AB 3-0 PS2 18 (SUTURE) IMPLANT
SUT VIC AB 3-0 SH 18 (SUTURE) ×2 IMPLANT
TOWEL OR 17X24 6PK STRL BLUE (TOWEL DISPOSABLE) ×10 IMPLANT
TRAY FOLEY CATH SILVER 14FR (SET/KITS/TRAYS/PACK) IMPLANT
TUBING NON-CON 1/4 X 20 CONN (TUBING) ×1 IMPLANT
TUBING NON-CON 1/4 X 20' CONN (TUBING) ×1
WATER STERILE IRR 1000ML POUR (IV SOLUTION) ×5 IMPLANT
YANKAUER SUCT BULB TIP NO VENT (SUCTIONS) ×2 IMPLANT

## 2016-01-03 NOTE — Op Note (Signed)
Preoperative diagnosis: Stress urinary incontinence, perineal defect Postoperative diagnosis: Same Procedure: Solyx sling, perineoplasty Surgeon: Cheri Fowler M.D. Anesthesia: Gen.  Findings: She had a hypermobile urethra. With cystoscopy there was no injury to the bladder or urethra. Estimated blood loss: [id number] cc Complications: None  Procedure in detail:  Patient was taken to the operating room and placed in the dorsosupine position. General anesthesia was induced and she was placed in mobile stirrups. Perineum, vagina and lower abdomen were then prepped and draped in usual sterile fashion and bladder drained with a red Robinson catheter. Legs were elevated about halfway in the stirrups. The anterior vagina was grasped with Allis clamps proximally 1 1/2 fingerbreadths from the urethral meatus. Local anesthetic with half percent Marcaine with epi mixed with 1% lidocaine was infiltrated in the midline and bilaterally for hydrodissection. A 1 cm vertical incision was was then made in the vagina between the Allis clamps. The edges of the incision were then grasped with the Allis clamps. Metzenbaum scissors were used to sharply dissected the vaginal mucosa to each pubic ramus. The Solyx sling was then first placed on the patient's right side and anchored behind the pubic bone.  A good placement was achieved on the right side. Placement was achieved on the left side in a similar fashion submucosal to just past the pubic ramus. A right angle clamp was able to just barely be passed between the sling and the urethral tissue confirming good tension. Cystoscopy was performed which revealed a normal bladder and no evidence of injury to the bladder or the urethra. 200 cc of fluid was used for the cystoscopy. The cystoscope was removed. A Cred maneuver was performed and no leakage was seen. The Solyx sling was released on the left side. The vaginal incision was then closed with running locking 2-0 Vicryl with  adequate closure and adequate hemostasis.   Attention was turned to perineoplasty. The hymenal ring was grasped on each side with an Allis clamp at a distance that when brought together would just allow 2 fingers to pass in the vagina. A diamond-shaped piece of tissue was then removed with the apex about 3 cm in the vagina and the bottom about 2 cm down the perineal body. Bleeding was controlled with cautery. A deep layer of interrupted 2-0 Vicryl sutures was placed to bring the tissue back together. Running locking 2-0 Vicryl was then used to close the vagina from the apex to the hymenal ring. 3-0 Vicryl was then used to continue reapproximating the deep tissue and the perineal skin. This achieved good closure.   A Foley catheter was easily able to be passed into the bladder through the urethra. It was removed, I just did this to confirm urethral patency. Vaginal packing was placed with 2 inch gauze coated with Estrace cream since there was a fair amount of bleeding from the dissection for the sling. All instruments were then removed from the vagina. The patient tolerated the procedure well and was taken to the recovery room in stable condition. Counts were correct, she received Ancef 2 gm IV prior to the procedure, she had PAS hose on throughout the procedure.

## 2016-01-03 NOTE — Anesthesia Preprocedure Evaluation (Addendum)
Anesthesia Evaluation  Patient identified by MRN, date of birth, ID band Patient awake    Reviewed: Allergy & Precautions, NPO status , Patient's Chart, lab work & pertinent test results  History of Anesthesia Complications (+) PONV and history of anesthetic complications  Airway Mallampati: III  TM Distance: >3 FB Neck ROM: Full    Dental no notable dental hx. (+) Dental Advisory Given   Pulmonary sleep apnea ,    Pulmonary exam normal        Cardiovascular negative cardio ROS Normal cardiovascular exam     Neuro/Psych  Headaches, PSYCHIATRIC DISORDERS Bipolar Disorder    GI/Hepatic Neg liver ROS, GERD  ,  Endo/Other  Hypothyroidism   Renal/GU negative Renal ROS     Musculoskeletal   Abdominal   Peds  Hematology   Anesthesia Other Findings   Reproductive/Obstetrics                            Anesthesia Physical Anesthesia Plan  ASA: II  Anesthesia Plan: General   Post-op Pain Management:    Induction: Intravenous  Airway Management Planned: Oral ETT  Additional Equipment:   Intra-op Plan:   Post-operative Plan: Extubation in OR  Informed Consent: I have reviewed the patients History and Physical, chart, labs and discussed the procedure including the risks, benefits and alternatives for the proposed anesthesia with the patient or authorized representative who has indicated his/her understanding and acceptance.   Dental advisory given  Plan Discussed with: CRNA and Anesthesiologist  Anesthesia Plan Comments:        Anesthesia Quick Evaluation

## 2016-01-03 NOTE — Discharge Instructions (Signed)
**  you may begin taking Ibuprofen after 6:57 pm **

## 2016-01-03 NOTE — Interval H&P Note (Signed)
History and Physical Interval Note:  01/03/2016 11:36 AM  Yolanda Jordan  has presented today for surgery, with the diagnosis of SUI and perineal defect  The various methods of treatment have been discussed with the patient and family. After consideration of risks, benefits and other options for treatment, the patient has consented to  Procedure(s): TRANSVAGINAL TAPE (TVT) PROCEDURE (N/A) PERINEOPLASTY (N/A) as a surgical intervention .  The patient's history has been reviewed, patient examined, no change in status, stable for surgery.  I have reviewed the patient's chart and labs.  Questions were answered to the patient's satisfaction.     Maysin Carstens D

## 2016-01-03 NOTE — Transfer of Care (Signed)
Immediate Anesthesia Transfer of Care Note  Patient: Yolanda Jordan  Procedure(s) Performed: Procedure(s): TRANSVAGINAL TAPE (TVT) PROCEDURE (N/A) PERINEOPLASTY (N/A) CYSTOSCOPY (N/A)  Patient Location: PACU  Anesthesia Type:General  Level of Consciousness: awake, alert  and oriented  Airway & Oxygen Therapy: Patient Spontanous Breathing and Patient connected to nasal cannula oxygen  Post-op Assessment: Report given to RN and Post -op Vital signs reviewed and stable  Post vital signs: Reviewed and stable  Last Vitals:  Vitals:   01/03/16 1112  BP: 119/86  Pulse: 70  Resp: 18  Temp: 36.6 C    Last Pain:  Vitals:   01/03/16 1112  TempSrc: Oral      Patients Stated Pain Goal: 3 ([id number])  Complications: No apparent anesthesia complications

## 2016-01-03 NOTE — Anesthesia Postprocedure Evaluation (Signed)
Anesthesia Post Note  Patient: Yolanda Jordan  Procedure(s) Performed: Procedure(s) (LRB): TRANSVAGINAL TAPE (TVT) PROCEDURE (N/A) PERINEOPLASTY (N/A) CYSTOSCOPY (N/A)  Patient location during evaluation: PACU Anesthesia Type: General Level of consciousness: sedated Pain management: pain level controlled Vital Signs Assessment: post-procedure vital signs reviewed and stable Respiratory status: spontaneous breathing and respiratory function stable Cardiovascular status: stable Anesthetic complications: no     Last Vitals:  Vitals:   01/03/16 1322 01/03/16 1330  BP:  120/63  Pulse: 82 76  Resp: 10 16  Temp: 36.8 C     Last Pain:  Vitals:   01/03/16 1345  TempSrc:   PainSc: 0-No pain   Pain Goal: Patients Stated Pain Goal: 3 (01/03/16 1112)               Rory Xiang DANIEL

## 2016-01-03 NOTE — Anesthesia Procedure Notes (Signed)
Procedure Name: Intubation Date/Time: 01/03/2016 12:12 PM Performed by: Jonna Munro Pre-anesthesia Checklist: Patient identified, Emergency Drugs available, Suction available and Patient being monitored Patient Re-evaluated:Patient Re-evaluated prior to inductionOxygen Delivery Method: Circle system utilized Preoxygenation: Pre-oxygenation with 100% oxygen Intubation Type: IV induction Ventilation: Mask ventilation without difficulty Laryngoscope Size: 3 and Mac Grade View: Grade I Tube type: Oral Tube size: 7.0 mm Number of attempts: 1 Airway Equipment and Method: Patient positioned with wedge pillow and Stylet Placement Confirmation: ETT inserted through vocal cords under direct vision,  positive ETCO2 and breath sounds checked- equal and bilateral Tube secured with: Tape

## 2016-01-06 ENCOUNTER — Encounter (HOSPITAL_COMMUNITY): Payer: Self-pay | Admitting: Obstetrics and Gynecology

## 2016-06-30 DIAGNOSIS — K219 Gastro-esophageal reflux disease without esophagitis: Secondary | ICD-10-CM | POA: Insufficient documentation

## 2016-06-30 DIAGNOSIS — D126 Benign neoplasm of colon, unspecified: Secondary | ICD-10-CM | POA: Insufficient documentation

## 2016-06-30 DIAGNOSIS — Z8371 Family history of colonic polyps: Secondary | ICD-10-CM | POA: Insufficient documentation

## 2016-07-03 DIAGNOSIS — H5213 Myopia, bilateral: Secondary | ICD-10-CM | POA: Insufficient documentation

## 2016-07-03 DIAGNOSIS — H52203 Unspecified astigmatism, bilateral: Secondary | ICD-10-CM | POA: Insufficient documentation

## 2017-12-30 ENCOUNTER — Other Ambulatory Visit (HOSPITAL_COMMUNITY): Payer: Self-pay | Admitting: General Surgery

## 2017-12-30 ENCOUNTER — Telehealth: Payer: Self-pay

## 2017-12-30 NOTE — Telephone Encounter (Signed)
    [facility] Pre-operative Risk Assessment    Request for surgical clearance:  1. What type of surgery is being performed? Gastric Bypass   2. When is this surgery scheduled?  TBD   3. What type of clearance is required (medical clearance vs. Pharmacy clearance to hold med vs. Both)?  MEDICAL  4. Are there any medications that need to be held prior to surgery and how long?    5. Practice name and name of physician performing surgery?  [facility]/Dr Hoxworth   6. What is your office phone number [phone]    7.   What is your office fax number [phone]  8.   Anesthesia type (None, local, MAC, general) ? general   Yolanda Jordan 12/30/2017, 11:17 AM  _________________________________________________________________   (provider comments below)

## 2017-12-30 NOTE — Telephone Encounter (Signed)
   Primary Cardiologist: Will be new  Chart reviewed as part of pre-operative protocol coverage. Because of Yolanda Jordan's past medical history and time since last visit, he/she will require a follow-up visit in order to better assess preoperative cardiovascular risk.  Pre-op covering staff: - Please schedule appointment and call patient to inform them. - Please contact requesting surgeon's office via preferred method (i.e, phone, fax) to inform them of need for appointment prior to surgery.  Please schedule new provider appointment with either Dr. Alveda Reasons or Dr. Harrell Gave at [facility] for pre-op.    [name], [place]  12/30/2017, 2:28 PM

## 2017-12-30 NOTE — Telephone Encounter (Signed)
Spoke with pt and she states that she is unsure if she will go through with the surgery. Pt asked if would could call her next week about a new patient appointment.

## 2018-01-03 NOTE — Telephone Encounter (Signed)
Pt states that she can not go through with the surgery at this time because her primary care doctor would not approve it.

## 2018-01-07 ENCOUNTER — Ambulatory Visit: Payer: PRIVATE HEALTH INSURANCE | Admitting: Dietician

## 2018-01-12 DIAGNOSIS — I10 Essential (primary) hypertension: Secondary | ICD-10-CM | POA: Insufficient documentation

## 2018-01-25 ENCOUNTER — Ambulatory Visit: Payer: PRIVATE HEALTH INSURANCE | Admitting: Internal Medicine

## 2018-09-12 ENCOUNTER — Other Ambulatory Visit: Payer: Self-pay | Admitting: Obstetrics and Gynecology

## 2019-05-09 DIAGNOSIS — N1831 Chronic kidney disease, stage 3a: Secondary | ICD-10-CM | POA: Insufficient documentation

## 2019-05-18 ENCOUNTER — Ambulatory Visit: Payer: PRIVATE HEALTH INSURANCE | Attending: Internal Medicine

## 2019-05-18 DIAGNOSIS — Z23 Encounter for immunization: Secondary | ICD-10-CM

## 2019-05-18 NOTE — Progress Notes (Signed)
   Covid-19 Vaccination Clinic  Name:  Yolanda Jordan    MRN: [id number] DOB: 13-Aug-1965  05/18/2019  [name] was observed post Covid-19 immunization for 15 minutes without incident. She was provided with Vaccine Information Sheet and instruction to access the V-Safe system.   [name] was instructed to call 911 with any severe reactions post vaccine: Marland Kitchen Difficulty breathing  . Swelling of face and throat  . A fast heartbeat  . A bad rash all over body  . Dizziness and weakness   Immunizations Administered    Name Date Dose VIS Date Route   Pfizer COVID-19 Vaccine 05/18/2019  1:15 PM 0.3 mL 02/10/2019 Intramuscular   Manufacturer: [place]   Lot: [id number]   [place]: [id number]

## 2019-06-13 ENCOUNTER — Ambulatory Visit: Payer: Medicare HMO | Attending: Internal Medicine

## 2019-06-13 ENCOUNTER — Emergency Department
Admission: EM | Admit: 2019-06-13 | Discharge: 2019-06-13 | Disposition: A | Discharge status: Home / Self Care | Payer: Medicare HMO | Source: Home / Self Care

## 2019-06-13 ENCOUNTER — Other Ambulatory Visit: Payer: Self-pay

## 2019-06-13 DIAGNOSIS — H60312 Diffuse otitis externa, left ear: Secondary | ICD-10-CM | POA: Diagnosis not present

## 2019-06-13 DIAGNOSIS — Z23 Encounter for immunization: Secondary | ICD-10-CM

## 2019-06-13 MED ORDER — CIPROFLOXACIN-DEXAMETHASONE 0.3-0.1 % OT SUSP: 4.0000 [drp] | Freq: Two times a day (BID) | OTIC | 0 refills | Status: AC

## 2019-06-13 MED ORDER — CEPHALEXIN 500 MG PO CAPS: 500.0000 mg | ORAL_CAPSULE | Freq: Two times a day (BID) | ORAL | 0 refills | Status: DC

## 2019-06-13 NOTE — Discharge Instructions (Addendum)
  You may try the antibiotic drops first but if the pain and swelling of your external ear continues to worsen, it is recommended you start taking the oral antibiotic.  If unsure, you may follow up with your family doctor or return to urgent care for another evaluation.  You may take Tylenol and motrin and apply a warm damp wash cloth 2-3 times daily to help with pain.

## 2019-06-13 NOTE — ED Provider Notes (Signed)
[facility]    CSN: [id number] Arrival date & time: 06/13/19  1738      History   Chief Complaint Chief Complaint  Patient presents with  . Otalgia    HPI Yolanda Jordan is a 54 y.o. female.   HPI  Yolanda Jordan is a 54 y.o. female presenting to UC with c/o 3 days of gradually worsening Left ear pain that is aching and sore, worse with touch, difficult sleeping on that side.  She has taken tylenol w/o relief.  No change in hearing. Denies fever, chills, n/v/d. No cough, congestion, sore throat, HA or dizziness.    Past Medical History:  Diagnosis Date  . Bipolar 1 disorder ([place])   . Cancer ([place]) 2000   thyroid cancer  . Dyspnea    panic attacks, high humidity  . GERD (gastroesophageal reflux disease)   . Headache    Migraines  . Hypothyroidism   . Memory loss   . PONV (postoperative nausea and vomiting)   . Sleep apnea   . Thyroid disease     There are no problems to display for this patient.   Past Surgical History:  Procedure Laterality Date  . APPENDECTOMY    . BLADDER SUSPENSION N/A 01/03/2016   Procedure: TRANSVAGINAL TAPE (TVT) PROCEDURE;  Surgeon: Cheri Fowler, MD;  Location: [facility];  Service: Gynecology;  Laterality: N/A;  . CESAREAN SECTION    . CYSTOSCOPY N/A 01/03/2016   Procedure: CYSTOSCOPY;  Surgeon: Cheri Fowler, MD;  Location: [facility];  Service: Gynecology;  Laterality: N/A;  . HEMORROIDECTOMY     age 75  . PERINEOPLASTY N/A 01/03/2016   Procedure: PERINEOPLASTY;  Surgeon: Cheri Fowler, MD;  Location: [facility];  Service: Gynecology;  Laterality: N/A;  . THYROIDECTOMY    . TUBAL LIGATION    . uterine ablation     05/2010  . WISDOM TOOTH EXTRACTION      OB History   No obstetric history on file.      Home Medications    Prior to Admission medications   Medication Sig Start Date End Date Taking? Authorizing Provider  amLODipine (NORVASC) 2.5 MG tablet Take 2.5 mg by mouth daily.   Yes [provider]  brexpiprazole  (REXULTI) 1 MG TABS tablet Take 1 mg by mouth daily.   Yes [provider]  buPROPion (WELLBUTRIN XL) 150 MG 24 hr tablet Take 150 mg by mouth daily. Three tablets once per day   Yes [provider]  pantoprazole (PROTONIX) 40 MG tablet Take 40 mg by mouth daily.   Yes [provider]  ALPRAZolam Duanne Moron) 1 MG tablet Take 1 mg by mouth See admin instructions. Up to 6 x daily prn for anxiety    [provider]  ARIPiprazole (ABILIFY) 20 MG tablet TAKE 1 TABLET BY MOUTH DAILY 11/20/15   [provider]  cephALEXin (KEFLEX) 500 MG capsule Take 1 capsule (500 mg total) by mouth 2 (two) times daily. 06/13/19   Noe Gens, PA-C  Cholecalciferol (VITAMIN D PO) Take 1 tablet by mouth daily.    [provider]  ciprofloxacin-dexamethasone (CIPRODEX) OTIC suspension Place 4 drops into the left ear 2 (two) times daily for 7 days. 06/13/19 06/20/19  Noe Gens, PA-C  estradiol (ESTRACE) 1 MG tablet TAKE 1 TABLET BY MOUTH DAILY 11/26/15   [provider]  lamoTRIgine (LAMICTAL) 150 MG tablet Take 300 mg by mouth at bedtime. Anti-seizure medication.  Consult needed.  [provider]  levothyroxine (SYNTHROID, LEVOTHROID) 137 MCG tablet Take 137 mcg by mouth daily.    [provider]  lithium carbonate 300 MG capsule TAKE 2 CAPSULES (600MG ) IN THE MORNING AND 1 CAPSULE (300) AT BEDTIME 11/28/15   [provider]  medroxyPROGESTERone (PROVERA) 2.5 MG tablet TAKE 1 TABLET BY MOUTH DAILY 11/26/15   [provider]  Polyethyl Glycol-Propyl Glycol (SYSTANE) 0.4-0.3 % SOLN Apply 1 drop to eye daily as needed (for dry eye).    [provider]  POTASSIUM PO Take 1 tablet by mouth daily. OTC VITAMIN    [provider]  rizatriptan (MAXALT) 10 MG tablet Take 10 mg by mouth every 2 (two) hours as needed for migraine. 10/16/15   [provider]    Family History Family History  Problem Relation  Age of Onset  . Healthy Mother   . Hypertension Father   . Diabetes Father   . Renal Disease Father     Social History Social History   Tobacco Use  . Smoking status: Never Smoker  . Smokeless tobacco: Never Used  Substance Use Topics  . Alcohol use: Yes    Comment: every couple days  . Drug use: Yes    Types: Marijuana    Comment: occ     Allergies   Morphine and related   Review of Systems Review of Systems  Constitutional: Negative for chills and fever.  HENT: Positive for ear pain (Left). Negative for congestion, ear discharge, facial swelling, sore throat, trouble swallowing and voice change.   Respiratory: Negative for cough and shortness of breath.   Cardiovascular: Negative for chest pain and palpitations.  Gastrointestinal: Negative for abdominal pain, diarrhea, nausea and vomiting.  Musculoskeletal: Negative for arthralgias, back pain and myalgias.  Skin: Negative for rash.  All other systems reviewed and are negative.    Physical Exam Triage Vital Signs ED Triage Vitals  Enc Vitals Group     BP 06/13/19 1758 (!) 143/95     Pulse Rate 06/13/19 1758 86     Resp 06/13/19 1758 16     Temp 06/13/19 1758 98.5 F (36.9 C)     Temp Source 06/13/19 1758 Oral     SpO2 06/13/19 1758 98 %     Weight --      Height --      Head Circumference --      Peak Flow --      Pain Score 06/13/19 1752 9     Pain Loc --      Pain Edu? --      Excl. in [place]? --    No data found.  Updated Vital Signs BP (!) 143/95 (BP Location: Right Arm)   Pulse 86   Temp 98.5 F (36.9 C) (Oral)   Resp 16   SpO2 98%   Visual Acuity Right Eye Distance:   Left Eye Distance:   Bilateral Distance:    Right Eye Near:   Left Eye Near:    Bilateral Near:     Physical Exam Vitals and nursing note reviewed.  Constitutional:      Appearance: Normal appearance. She is well-developed.  HENT:     Head: Normocephalic and atraumatic.     Right Ear: Tympanic membrane and ear canal  normal.     Left Ear: Swelling ( mild) and tenderness present. No drainage.  No middle ear effusion. No mastoid tenderness. Tympanic membrane is not erythematous or bulging.  Ears:     Comments: Left ear: erythema and mild swelling of external ear and ear canal. No drainage.     Nose: Nose normal.     Right Sinus: No maxillary sinus tenderness or frontal sinus tenderness.     Left Sinus: No maxillary sinus tenderness or frontal sinus tenderness.     Mouth/Throat:     Lips: Pink.     Mouth: Mucous membranes are moist.     Pharynx: Oropharynx is clear. Uvula midline.  Cardiovascular:     Rate and Rhythm: Normal rate and regular rhythm.  Pulmonary:     Effort: Pulmonary effort is normal. No respiratory distress.     Breath sounds: Normal breath sounds.  Musculoskeletal:        General: Normal range of motion.     Cervical back: Normal range of motion.  Skin:    General: Skin is warm and dry.  Neurological:     Mental Status: She is alert and oriented to person, place, and time.  Psychiatric:        Behavior: Behavior normal.      UC Treatments / Results  Labs (all labs ordered are listed, but only abnormal results are displayed) Labs Reviewed - No data to display  EKG   Radiology No results found.  Procedures Procedures (including critical care time)  Medications Ordered in UC Medications - No data to display  Initial Impression / Assessment and Plan / UC Course  I have reviewed the triage vital signs and the nursing notes.  Pertinent labs & imaging results that were available during my care of the patient were reviewed by me and considered in my medical decision making (see chart for details).     Hx and exam c/w otitis externa. Due to erythema and mild swelling of external ear, discussed prescribing oral cephalexin in addition to otic drops. Pt hesitant due to recent episode of C. Diff after being on multiple antibiotics for H. Pylori.  Pt to try Ciprodex  otic drops, may start cephalexin if not improving, may also f/u with PCP as needed  AVS provided  Final Clinical Impressions(s) / UC Diagnoses   Final diagnoses:  Acute diffuse otitis externa of left ear     Discharge Instructions      You may try the antibiotic drops first but if the pain and swelling of your external ear continues to worsen, it is recommended you start taking the oral antibiotic.  If unsure, you may follow up with your family doctor or return to urgent care for another evaluation.  You may take Tylenol and motrin and apply a warm damp wash cloth 2-3 times daily to help with pain.     ED Prescriptions    Medication Sig Dispense Auth. Provider   ciprofloxacin-dexamethasone (CIPRODEX) OTIC suspension Place 4 drops into the left ear 2 (two) times daily for 7 days. 7.5 mL Gerarda Fraction, Kirke Breach O, PA-C   cephALEXin (KEFLEX) 500 MG capsule Take 1 capsule (500 mg total) by mouth 2 (two) times daily. 14 capsule Noe Gens, [place]     I have reviewed the PDMP during this encounter.   Noe Gens, [place] 06/16/19 [phone]

## 2019-06-13 NOTE — Progress Notes (Signed)
   Covid-19 Vaccination Clinic  Name:  Yolanda Jordan    MRN: [id number] DOB: 07/25/1965  06/13/2019  [name] was observed post Covid-19 immunization for 15 minutes without incident. She was provided with Vaccine Information Sheet and instruction to access the V-Safe system.   [name] was instructed to call 911 with any severe reactions post vaccine: Marland Kitchen Difficulty breathing  . Swelling of face and throat  . A fast heartbeat  . A bad rash all over body  . Dizziness and weakness   Immunizations Administered    Name Date Dose VIS Date Route   Pfizer COVID-19 Vaccine 06/13/2019  9:18 AM 0.3 mL 02/10/2019 Intramuscular   Manufacturer: [place]   Lot: [id number]   [place]: [id number]

## 2019-06-13 NOTE — ED Triage Notes (Signed)
Patient presents to Urgent Care with complaints of left ear pain since three nights ago. Patient reports it has gotten progressively worse, taking tylenol w/o relief. Hearing is not impacted.

## 2019-06-21 ENCOUNTER — Encounter (INDEPENDENT_AMBULATORY_CARE_PROVIDER_SITE_OTHER): Payer: Self-pay | Admitting: Bariatrics

## 2019-06-21 ENCOUNTER — Other Ambulatory Visit: Payer: Self-pay

## 2019-06-21 ENCOUNTER — Ambulatory Visit (INDEPENDENT_AMBULATORY_CARE_PROVIDER_SITE_OTHER): Payer: Medicare HMO | Admitting: Bariatrics

## 2019-06-21 VITALS — BP 132/89 | HR 74 | Temp 98.0°F | Ht 68.0 in | Wt 248.0 lb

## 2019-06-21 DIAGNOSIS — E559 Vitamin D deficiency, unspecified: Secondary | ICD-10-CM

## 2019-06-21 DIAGNOSIS — Z6837 Body mass index (BMI) 37.0-37.9, adult: Secondary | ICD-10-CM

## 2019-06-21 DIAGNOSIS — R7303 Prediabetes: Secondary | ICD-10-CM

## 2019-06-21 DIAGNOSIS — Z0289 Encounter for other administrative examinations: Secondary | ICD-10-CM

## 2019-06-21 DIAGNOSIS — Z1331 Encounter for screening for depression: Secondary | ICD-10-CM | POA: Diagnosis not present

## 2019-06-21 DIAGNOSIS — I10 Essential (primary) hypertension: Secondary | ICD-10-CM

## 2019-06-21 DIAGNOSIS — R5383 Other fatigue: Secondary | ICD-10-CM

## 2019-06-21 DIAGNOSIS — E038 Other specified hypothyroidism: Secondary | ICD-10-CM | POA: Diagnosis not present

## 2019-06-21 DIAGNOSIS — R0602 Shortness of breath: Secondary | ICD-10-CM

## 2019-06-21 DIAGNOSIS — N1831 Chronic kidney disease, stage 3a: Secondary | ICD-10-CM

## 2019-06-21 DIAGNOSIS — E66812 Obesity, class 2: Secondary | ICD-10-CM

## 2019-06-21 NOTE — Progress Notes (Signed)
Chief Complaint:   OBESITY TONAE Jordan (MR# [id number]) is a 54 y.o. female who presents for evaluation and treatment of obesity and related comorbidities. Current BMI is Body mass index is 37.71 kg/m.Yolanda Jordan has been struggling with her weight for many years and has been unsuccessful in either losing weight, maintaining weight loss, or reaching her healthy weight goal.  Yolanda Jordan is currently in the action stage of change and ready to dedicate time achieving and maintaining a healthier weight. Yolanda Jordan is interested in becoming our patient and working on intensive lifestyle modifications including (but not limited to) diet and exercise for weight loss.  Yolanda Jordan does not like to cook due to lack of energy and time constraints. She skips lunch. She is a stay-at-home mom. She says she "eats what she wants" and eats out a lot. She drinks 6-8 bottles of water per day. She likes sweet tea.  Yolanda Jordan habits were reviewed today and are as follows: Her family eats meals together, she thinks her family will eat healthier with her, she struggles with family and or coworkers weight loss sabotage, her desired weight loss is 88 lbs, she started gaining weight during her five pregnancies, her heaviest weight ever was 248 pounds, she craves desserts and chips, she skips lunch everyday, she frequently makes poor food choices, she has problems with excessive hunger, she has binge eating behaviors and she struggles with emotional eating.  Depression Screen Yolanda Jordan's Food and Mood (modified PHQ-9) score was 16.  Depression screen [facility] 2/9 06/21/2019  Decreased Interest 3  Down, Depressed, Hopeless 2  PHQ - 2 Score 5  Altered sleeping 1  Tired, decreased energy 3  Change in appetite 2  Feeling bad or failure about yourself  1  Trouble concentrating 1  Moving slowly or fidgety/restless 3  Suicidal thoughts 0  PHQ-9 Score 16  Difficult doing work/chores Somewhat difficult   Subjective:   Other fatigue. Yolanda Jordan denies  daytime somnolence and denies waking up still tired. Yolanda Jordan generally gets 8-9 hours of sleep per night, and states that she has generally restful sleep. Snoring is present. Apneic episodes are not present. Epworth Sleepiness Score is 3.  SOB (shortness of breath) on exertion. Yolanda Jordan notes increasing shortness of breath with certain activities and seems to be worsening over time with weight gain. She notes getting out of breath sooner with activity than she used to. This has gotten worse recently. Yolanda Jordan denies shortness of breath at rest or orthopnea.  Prediabetes. Yolanda Jordan has a diagnosis of prediabetes based on her elevated HgA1c and was informed this puts her at greater risk of developing diabetes. She continues to work on diet and exercise to decrease her risk of diabetes. She denies nausea or hypoglycemia. Last glucose was elevated at 102.  No results found for: HGBA1C No results found for: INSULIN  Other specified hypothyroidism. Yolanda Jordan is taking levothyroxine. Symptoms are stable. She sees Endocrinology.  No results found for: TSH  Essential hypertension. Yolanda Jordan is taking Norvasc. Blood pressure is under control.  BP Readings from Last 3 Encounters:  06/21/19 132/89  06/13/19 (!) 143/95  01/03/16 117/67   Lab Results  Component Value Date   CREATININE 0.90 10/13/2011   CREATININE 0.78 08/29/2011   CREATININE 0.60 08/17/2011   Vitamin D deficiency. Yolanda Jordan is taking OTC Vitamin D.  Stage 3a chronic kidney disease. New diagnosis of kidney disease is due to long-term lithium use. She says she will eventually need a kidney transplant. Is no longer taking  lithium.  Depression screening. Yolanda Jordan had a strongly positive depression screen with a PHQ-9 score of 16.  Assessment/Plan:   Other fatigue. Yolanda Jordan does feel that her weight is causing her energy to be lower than it should be. Fatigue may be related to obesity, depression or many other causes. Labs will be ordered, and in the meanwhile, Yolanda Jordan will  focus on self care including making healthy food choices, increasing physical activity and focusing on stress reduction. EKG 12-Lead, Lipid Panel With LDL/HDL Ratio testing ordered today.  SOB (shortness of breath) on exertion. Yolanda Jordan does feel that she gets out of breath more easily that she used to when she exercises. Yolanda Jordan shortness of breath appears to be obesity related and exercise induced. She has agreed to work on weight loss and gradually increase exercise to treat her exercise induced shortness of breath. Will continue to monitor closely.  Prediabetes. Yolanda Jordan will continue to work on weight loss, exercise, and decreasing simple carbohydrates to help decrease the risk of diabetes. Hemoglobin A1c, Insulin, random labs ordered today.  Other specified hypothyroidism. Patient with long-standing hypothyroidism, on levothyroxine therapy. She appears euthyroid. Orders and follow up as documented in patient record. Yolanda Jordan will continue her medication as directed. T3, T4, free, TSH labs ordered today.  Counseling . Good thyroid control is important for overall health. Supratherapeutic thyroid levels are dangerous and will not improve weight loss results. . The correct way to take levothyroxine is fasting, with water, separated by at least 30 minutes from breakfast, and separated by more than 4 hours from calcium, iron, multivitamins, acid reflux medications (PPIs).     Essential hypertension. Yolanda Jordan is working on healthy weight loss and exercise to improve blood pressure control. We will watch for signs of hypotension as she continues her lifestyle modifications. She will continue her medication daily as directed.  Vitamin D deficiency. Low Vitamin D level contributes to fatigue and are associated with obesity, breast, and colon cancer. VITAMIN D 25 Hydroxy (Vit-D Deficiency, Fractures) level ordered today.  Stage 3a chronic kidney disease. Yolanda Jordan will not go above 0.8 protein due to CKD.  Depression  screening. Yolanda Jordan had a positive depression screening. Depression is commonly associated with obesity and often results in emotional eating behaviors. We will monitor this closely and work on CBT to help improve the non-hunger eating patterns. Referral to Psychology may be required if no improvement is seen as she continues in our clinic.  Class 2 severe obesity with serious comorbidity and body mass index (BMI) of 37.0 to 37.9 in adult, unspecified obesity type ([place]).  Yolanda Jordan is currently in the action stage of change and her goal is to continue with weight loss efforts. I recommend Yolanda Jordan begin the structured treatment plan as follows:  She has agreed to the Category 2 Plan. She will have 6 oz of meat at dinner to keep her at 8 oz of protein or less. She will keep her sodium between 2000 and 3000.  She will work on meal planning and will not go over 0.8 protein due to CKD.  We reviewed with the patient labs from 05/09/2019 including CMP.  Handout was given on Eating Out.  Exercise goals: All adults should avoid inactivity. Some physical activity is better than none, and adults who participate in any amount of physical activity gain some health benefits.   Behavioral modification strategies: increasing lean protein intake, decreasing simple carbohydrates, increasing vegetables, increasing water intake, decreasing eating out, no skipping meals, meal planning and cooking strategies and  keeping healthy foods in the home.  She was informed of the importance of frequent follow-up visits to maximize her success with intensive lifestyle modifications for her multiple health conditions. She was informed we would discuss her lab results at her next visit unless there is a critical issue that needs to be addressed sooner. Yolanda Jordan agreed to keep her next visit at the agreed upon time to discuss these results.  Objective:   Blood pressure 132/89, pulse 74, temperature 98 F (36.7 C), height 5\' 8"  (1.727 m),  weight 248 lb (112.5 kg), SpO2 99 %. Body mass index is 37.71 kg/m.  EKG: Sinus  Rhythm with a rate of 74 BPM. Low voltage in precordial leads. Left axis. Abnormal.   Indirect Calorimeter completed today shows a VO2 of 229 and a REE of 1593.  Her calculated basal metabolic rate is [id number] thus her basal metabolic rate is worse than expected.  General: Cooperative, alert, well developed, in no acute distress. HEENT: Conjunctivae and lids unremarkable. Cardiovascular: Regular rhythm.  Lungs: Normal work of breathing. Neurologic: No focal deficits.   Lab Results  Component Value Date   CREATININE 0.90 10/13/2011   BUN 7 10/13/2011   NA 139 10/13/2011   K 4.0 10/13/2011   CL 102 10/13/2011   CO2 26 10/13/2011   Lab Results  Component Value Date   ALT 12 08/29/2011   AST 15 08/29/2011   ALKPHOS 68 08/29/2011   BILITOT 0.1 (L) 08/29/2011   No results found for: HGBA1C No results found for: INSULIN No results found for: TSH No results found for: CHOL, HDL, LDLCALC, LDLDIRECT, TRIG, CHOLHDL Lab Results  Component Value Date   WBC 10.1 12/24/2015   HGB 12.5 12/24/2015   HCT 39.3 12/24/2015   MCV 92.3 12/24/2015   PLT 333 12/24/2015   No results found for: IRON, TIBC, FERRITIN  Obesity Behavioral Intervention Visit Documentation for Insurance:   Approximately 15 minutes were spent on the discussion below.  ASK: We discussed the diagnosis of obesity with Yolanda Jordan today and Yolanda Jordan agreed to give [place] permission to discuss obesity behavioral modification therapy today.  ASSESS: Yolanda Jordan has the diagnosis of obesity and her BMI today is 37.8. Bertrice is in the action stage of change.   ADVISE: Yolanda Jordan was educated on the multiple health risks of obesity as well as the benefit of weight loss to improve her health. She was advised of the need for long term treatment and the importance of lifestyle modifications to improve her current health and to decrease her risk of future health  problems.  AGREE: Multiple dietary modification options and treatment options were discussed and Yolanda Jordan agreed to follow the recommendations documented in the above note.  ARRANGE: Yolanda Jordan was educated on the importance of frequent visits to treat obesity as outlined per CMS and USPSTF guidelines and agreed to schedule her next follow up appointment today.  Attestation Statements:   Reviewed by clinician on day of visit: allergies, medications, problem list, medical history, surgical history, family history, social history, and previous encounter notes.  Migdalia Dk, am acting as Location manager for [employer], DO   I have reviewed the above documentation for accuracy and completeness, and I agree with the above. Jearld Lesch, DO

## 2019-06-22 ENCOUNTER — Encounter (INDEPENDENT_AMBULATORY_CARE_PROVIDER_SITE_OTHER): Payer: Self-pay | Admitting: Bariatrics

## 2019-06-22 DIAGNOSIS — E6609 Other obesity due to excess calories: Secondary | ICD-10-CM | POA: Insufficient documentation

## 2019-06-22 DIAGNOSIS — Z6837 Body mass index (BMI) 37.0-37.9, adult: Secondary | ICD-10-CM | POA: Insufficient documentation

## 2019-06-22 DIAGNOSIS — E66812 Obesity, class 2: Secondary | ICD-10-CM | POA: Insufficient documentation

## 2019-06-22 LAB — INSULIN, RANDOM: INSULIN: 10.8 u[IU]/mL (ref 2.6–24.9)

## 2019-06-22 LAB — T4, FREE: Free T4: 1.82 ng/dL — ABNORMAL HIGH (ref 0.82–1.77)

## 2019-06-22 LAB — T3: T3, Total: 110 ng/dL (ref 71–180)

## 2019-06-22 LAB — LIPID PANEL WITH LDL/HDL RATIO
Cholesterol, Total: 195 mg/dL (ref 100–199)
HDL: 55 mg/dL (ref >39)
LDL Chol Calc (NIH): 119 mg/dL — ABNORMAL HIGH (ref 0–99)
LDL/HDL Ratio: 2.2 ratio (ref 0.0–3.2)
Triglycerides: 117 mg/dL (ref 0–149)
VLDL Cholesterol Cal: 21 mg/dL (ref 5–40)

## 2019-06-22 LAB — HEMOGLOBIN A1C
Est. average glucose Bld gHb Est-mCnc: 111 mg/dL
Hgb A1c MFr Bld: 5.5 % (ref 4.8–5.6)

## 2019-06-22 LAB — TSH: TSH: 0.948 u[IU]/mL (ref 0.450–4.500)

## 2019-06-22 LAB — VITAMIN D 25 HYDROXY (VIT D DEFICIENCY, FRACTURES): Vit D, 25-Hydroxy: 46.5 ng/mL (ref 30.0–100.0)

## 2019-07-05 ENCOUNTER — Ambulatory Visit (INDEPENDENT_AMBULATORY_CARE_PROVIDER_SITE_OTHER): Payer: Medicare HMO | Admitting: Bariatrics

## 2019-07-05 ENCOUNTER — Encounter (INDEPENDENT_AMBULATORY_CARE_PROVIDER_SITE_OTHER): Payer: Self-pay | Admitting: Bariatrics

## 2019-07-05 ENCOUNTER — Other Ambulatory Visit: Payer: Self-pay

## 2019-07-05 VITALS — BP 137/79 | HR 78 | Temp 98.2°F | Ht 68.0 in | Wt 245.0 lb

## 2019-07-05 DIAGNOSIS — E8881 Metabolic syndrome: Secondary | ICD-10-CM | POA: Diagnosis not present

## 2019-07-05 DIAGNOSIS — E559 Vitamin D deficiency, unspecified: Secondary | ICD-10-CM | POA: Diagnosis not present

## 2019-07-05 DIAGNOSIS — E038 Other specified hypothyroidism: Secondary | ICD-10-CM

## 2019-07-05 DIAGNOSIS — Z6837 Body mass index (BMI) 37.0-37.9, adult: Secondary | ICD-10-CM

## 2019-07-05 MED ORDER — VITAMIN D (ERGOCALCIFEROL) 1.25 MG (50000 UNIT) PO CAPS: 50000.0000 [IU] | ORAL_CAPSULE | ORAL | 0 refills | Status: DC

## 2019-07-05 NOTE — Progress Notes (Signed)
Chief Complaint:   OBESITY Yolanda Jordan is here to discuss her progress with her obesity treatment plan along with follow-up of her obesity related diagnoses. [name] is on the Category 2 Plan and states she is following her eating plan approximately 70% of the time. Yolanda Jordan states she is exercising 0 minutes 0 times per week.  Today's visit was #: 2 Starting weight: 248 lbs Starting date: 06/21/2019 Today's weight: 245 lbs Today's date: 07/05/2019 Total lbs lost to date: 3 Total lbs lost since last in-office visit: 3  Interim History: Yolanda Jordan is down 3 lbs from her last visit. She struggles with dinner (eating out and husband cooking rich meals in the evening).  Subjective:   Insulin resistance (mild). Yolanda Jordan has a diagnosis of insulin resistance based on her elevated fasting insulin level >5. She continues to work on diet and exercise to decrease her risk of diabetes. Yolanda Jordan states she had been prediabetic.  Lab Results  Component Value Date   INSULIN 10.8 06/21/2019   Lab Results  Component Value Date   HGBA1C 5.5 06/21/2019   Other specified hypothyroidism. Yolanda Jordan is taking Synthroid. TSH and T3 normal; T4 elevated slightly.   Lab Results  Component Value Date   TSH 0.948 06/21/2019   Vitamin D deficiency. Yolanda Jordan is taking OTC Vitamin D. Last Vitamin D 46.5 on 06/21/2019.  Assessment/Plan:   Insulin resistance (mid). Wrynn will continue to work on weight loss, exercise, increasing healthy fats and protein, and decreasing simple carbohydrates to help decrease the risk of diabetes. Yolanda Jordan agreed to follow-up with [place] as directed to closely monitor her progress.  Other specified hypothyroidism. Patient with long-standing hypothyroidism, on levothyroxine therapy. She appears euthyroid. Orders and follow up as documented in patient record. Yolanda Jordan will continue Synthroid with no change in dose at this time.  Counseling . Good thyroid control is important for overall health. Supratherapeutic  thyroid levels are dangerous and will not improve weight loss results. . The correct way to take levothyroxine is fasting, with water, separated by at least 30 minutes from breakfast, and separated by more than 4 hours from calcium, iron, multivitamins, acid reflux medications (PPIs).   Vitamin D deficiency. Low Vitamin D level contributes to fatigue and are associated with obesity, breast, and colon cancer. She was given a prescription for Vitamin D, Ergocalciferol, (DRISDOL) 1.25 MG (50000 UNIT) CAPS capsule every 2 weeks #2 with 0 refills and will follow-up for routine testing of Vitamin D, at least 2-3 times per year to avoid over-replacement.    Class 2 severe obesity with serious comorbidity and body mass index (BMI) of 37.0 to 37.9 in adult, unspecified obesity type ([name]).  Yolanda Jordan is currently in the action stage of change. As such, her goal is to continue with weight loss efforts. She has agreed to the Category 2 Plan.   We independently reviewed with the patient labs from 06/21/2019 including lipids, Vitamin D, A1c, insulin, and thyroid panel.  Exercise goals: Yolanda Jordan will start walking.  Behavioral modification strategies: increasing lean protein intake, decreasing simple carbohydrates, increasing vegetables, increasing water intake, decreasing eating out, no skipping meals, meal planning and cooking strategies, keeping healthy foods in the home and planning for success.  Yolanda Jordan has agreed to follow-up with our clinic in 2 weeks. She was informed of the importance of frequent follow-up visits to maximize her success with intensive lifestyle modifications for her multiple health conditions.   Objective:   Blood pressure 137/79, pulse 78, temperature 98.2 F (  36.8 C), height 5\' 8"  (1.727 m), weight 245 lb (111.1 kg), SpO2 99 %. Body mass index is 37.25 kg/m.  General: Cooperative, alert, well developed, in no acute distress. HEENT: Conjunctivae and lids unremarkable. Cardiovascular:  Regular rhythm.  Lungs: Normal work of breathing. Neurologic: No focal deficits.   Lab Results  Component Value Date   CREATININE 0.90 10/13/2011   BUN 7 10/13/2011   NA 139 10/13/2011   K 4.0 10/13/2011   CL 102 10/13/2011   CO2 26 10/13/2011   Lab Results  Component Value Date   ALT 12 08/29/2011   AST 15 08/29/2011   ALKPHOS 68 08/29/2011   BILITOT 0.1 (L) 08/29/2011   Lab Results  Component Value Date   HGBA1C 5.5 06/21/2019   Lab Results  Component Value Date   INSULIN 10.8 06/21/2019   Lab Results  Component Value Date   TSH 0.948 06/21/2019   Lab Results  Component Value Date   CHOL 195 06/21/2019   HDL 55 06/21/2019   LDLCALC 119 (H) 06/21/2019   TRIG 117 06/21/2019   Lab Results  Component Value Date   WBC 10.1 12/24/2015   HGB 12.5 12/24/2015   HCT 39.3 12/24/2015   MCV 92.3 12/24/2015   PLT 333 12/24/2015   No results found for: IRON, TIBC, FERRITIN  Obesity Behavioral Intervention Documentation for Insurance:   Approximately 15 minutes were spent on the discussion below.  ASK: We discussed the diagnosis of obesity with Yolanda Jordan today and Yolanda Jordan agreed to give [place] permission to discuss obesity behavioral modification therapy today.  ASSESS: Yolanda Jordan has the diagnosis of obesity and her BMI today is 37.3. Yolanda Jordan is in the action stage of change.   ADVISE: Yolanda Jordan was educated on the multiple health risks of obesity as well as the benefit of weight loss to improve her health. She was advised of the need for long term treatment and the importance of lifestyle modifications to improve her current health and to decrease her risk of future health problems.  AGREE: Multiple dietary modification options and treatment options were discussed and Yolanda Jordan agreed to follow the recommendations documented in the above note.  ARRANGE: Yolanda Jordan was educated on the importance of frequent visits to treat obesity as outlined per CMS and USPSTF guidelines and agreed to schedule her  next follow up appointment today.  Attestation Statements:   Reviewed by clinician on day of visit: allergies, medications, problem list, medical history, surgical history, family history, social history, and previous encounter notes.  Migdalia Dk, am acting as Location manager for [employer], DO   I have reviewed the above documentation for accuracy and completeness, and I agree with the above. Jearld Lesch, DO

## 2019-07-10 ENCOUNTER — Encounter (INDEPENDENT_AMBULATORY_CARE_PROVIDER_SITE_OTHER): Payer: Self-pay | Admitting: Bariatrics

## 2019-07-18 ENCOUNTER — Ambulatory Visit (INDEPENDENT_AMBULATORY_CARE_PROVIDER_SITE_OTHER): Payer: Medicare HMO | Admitting: Family Medicine

## 2019-07-18 ENCOUNTER — Encounter (INDEPENDENT_AMBULATORY_CARE_PROVIDER_SITE_OTHER): Payer: Self-pay | Admitting: Family Medicine

## 2019-07-18 ENCOUNTER — Other Ambulatory Visit: Payer: Self-pay

## 2019-07-18 VITALS — BP 123/82 | HR 74 | Temp 98.5°F | Ht 68.0 in | Wt 242.0 lb

## 2019-07-18 DIAGNOSIS — E559 Vitamin D deficiency, unspecified: Secondary | ICD-10-CM | POA: Diagnosis not present

## 2019-07-18 DIAGNOSIS — Z6836 Body mass index (BMI) 36.0-36.9, adult: Secondary | ICD-10-CM

## 2019-07-18 DIAGNOSIS — E88819 Insulin resistance, unspecified: Secondary | ICD-10-CM | POA: Insufficient documentation

## 2019-07-18 DIAGNOSIS — E8881 Metabolic syndrome: Secondary | ICD-10-CM

## 2019-07-18 MED ORDER — VITAMIN D (ERGOCALCIFEROL) 1.25 MG (50000 UNIT) PO CAPS: 50000.0000 [IU] | ORAL_CAPSULE | ORAL | 0 refills | Status: DC

## 2019-07-18 NOTE — Progress Notes (Signed)
Chief Complaint:   OBESITY Yolanda Jordan is here to discuss her progress with her obesity treatment plan along with follow-up of her obesity related diagnoses. Yolanda Jordan is on the Category 2 Plan and states she is following her eating plan approximately 50% of the time. Yolanda Jordan states she is doing 0 minutes 0 times per week.  Today's visit was #: 3 Starting weight: 248 lbs Starting date: 06/21/2019 Today's weight: 242 lbs Today's date: 07/18/2019 Total lbs lost to date: 6 Total lbs lost since last in-office visit: 3  Interim History: Yolanda Jordan is on the plan at breakfast and lunch but struggles at supper. She eats out at dinner quite often, and she is skipping some meals. Her family likes to eat out at [place] and [place].  Subjective:   1. Vitamin D deficiency Yolanda Jordan's last Vit D level is nearly at goal (46.5). She is on high dose Vit D.  2. Insulin resistance Yolanda Jordan has a diagnosis of insulin resistance based on her elevated fasting insulin level >5. She continues to work on diet and exercise to decrease her risk of diabetes. Yolanda Jordan denies polyphagia and she skips meals at times.  Lab Results  Component Value Date   INSULIN 10.8 06/21/2019   Lab Results  Component Value Date   HGBA1C 5.5 06/21/2019     Assessment/Plan:   1. Vitamin D deficiency Low Vitamin D level contributes to fatigue and are associated with obesity, breast, and colon cancer. We will refill prescription Vitamin D for 1 month. Yolanda Jordan will follow-up for routine testing of Vitamin D, at least 2-3 times per year to avoid over-replacement.  - Vitamin D, Ergocalciferol, (DRISDOL) 1.25 MG (50000 UNIT) CAPS capsule; Take 1 capsule (50,000 Units total) by mouth every 7 (seven) days.  Dispense: 4 capsule; Refill: 0  2. Insulin resistance Yolanda Jordan will continue her meal plan, and continue to work on weight loss, exercise, and decreasing simple carbohydrates to help decrease the risk of diabetes. Yolanda Jordan agreed to follow-up with [place] as directed to  closely monitor her progress.  3. Class 2 severe obesity with serious comorbidity and body mass index (BMI) of 36.0 to 36.9 in adult, unspecified obesity type Yolanda Jordan) Yolanda Jordan is currently in the action stage of change. As such, her goal is to continue with weight loss efforts. She has agreed to the Category 2 Plan and keeping a food journal and adhering to recommended goals of 400-500 calories and 35 grams of protein at supper daily. She may allow up to 700 calories ifshe has not eaten her snack calories.   Exercise goals: No exercise has been prescribed at this time.  Behavioral modification strategies: decreasing simple carbohydrates and no skipping meals.  Yolanda Jordan has agreed to follow-up with our clinic in 2 weeks. She was informed of the importance of frequent follow-up visits to maximize her success with intensive lifestyle modifications for her multiple health conditions.   Objective:   Blood pressure 123/82, pulse 74, temperature 98.5 F (36.9 C), temperature source Oral, height 5\' 8"  (1.727 m), weight 242 lb (109.8 kg), SpO2 100 %. Body mass index is 36.8 kg/m.  General: Cooperative, alert, well developed, in no acute distress. HEENT: Conjunctivae and lids unremarkable. Cardiovascular: Regular rhythm.  Lungs: Normal work of breathing. Neurologic: No focal deficits.   Lab Results  Component Value Date   CREATININE 0.90 10/13/2011   BUN 7 10/13/2011   NA 139 10/13/2011   K 4.0 10/13/2011   CL 102 10/13/2011   CO2 26 10/13/2011  Lab Results  Component Value Date   ALT 12 08/29/2011   AST 15 08/29/2011   ALKPHOS 68 08/29/2011   BILITOT 0.1 (L) 08/29/2011   Lab Results  Component Value Date   HGBA1C 5.5 06/21/2019   Lab Results  Component Value Date   INSULIN 10.8 06/21/2019   Lab Results  Component Value Date   TSH 0.948 06/21/2019   Lab Results  Component Value Date   CHOL 195 06/21/2019   HDL 55 06/21/2019   LDLCALC 119 (H) 06/21/2019   TRIG 117 06/21/2019    Lab Results  Component Value Date   WBC 10.1 12/24/2015   HGB 12.5 12/24/2015   HCT 39.3 12/24/2015   MCV 92.3 12/24/2015   PLT 333 12/24/2015   No results found for: IRON, TIBC, FERRITIN  Obesity Behavioral Intervention Documentation for Insurance:   Approximately 15 minutes were spent on the discussion below.  ASK: We discussed the diagnosis of obesity with Yolanda Jordan today and Yolanda Jordan agreed to give [place] permission to discuss obesity behavioral modification therapy today.  ASSESS: Yolanda Jordan has the diagnosis of obesity and her BMI today is 36.8. Yolanda Jordan is in the action stage of change.   ADVISE: Yolanda Jordan was educated on the multiple health risks of obesity as well as the benefit of weight loss to improve her health. She was advised of the need for long term treatment and the importance of lifestyle modifications to improve her current health and to decrease her risk of future health problems.  AGREE: Multiple dietary modification options and treatment options were discussed and Yolanda Jordan agreed to follow the recommendations documented in the above note.  ARRANGE: Yolanda Jordan was educated on the importance of frequent visits to treat obesity as outlined per CMS and USPSTF guidelines and agreed to schedule her next follow up appointment today.  Attestation Statements:   Reviewed by clinician on day of visit: allergies, medications, problem list, medical history, surgical history, family history, social history, and previous encounter notes.   Yolanda Jordan, am acting as Location manager for [employer], FNP-C.  I have reviewed the above documentation for accuracy and completeness, and I agree with the above. - Yolanda Fick, FNP

## 2019-08-02 ENCOUNTER — Ambulatory Visit (INDEPENDENT_AMBULATORY_CARE_PROVIDER_SITE_OTHER): Payer: Medicare HMO | Admitting: Family Medicine

## 2019-08-02 ENCOUNTER — Encounter (INDEPENDENT_AMBULATORY_CARE_PROVIDER_SITE_OTHER): Payer: Self-pay | Admitting: Family Medicine

## 2019-08-02 ENCOUNTER — Other Ambulatory Visit: Payer: Self-pay

## 2019-08-02 VITALS — BP 109/73 | HR 82 | Temp 98.0°F | Ht 68.0 in | Wt 243.0 lb

## 2019-08-02 DIAGNOSIS — Z6837 Body mass index (BMI) 37.0-37.9, adult: Secondary | ICD-10-CM | POA: Diagnosis not present

## 2019-08-02 DIAGNOSIS — E66812 Obesity, class 2: Secondary | ICD-10-CM

## 2019-08-02 DIAGNOSIS — I10 Essential (primary) hypertension: Secondary | ICD-10-CM | POA: Diagnosis not present

## 2019-08-02 DIAGNOSIS — E559 Vitamin D deficiency, unspecified: Secondary | ICD-10-CM

## 2019-08-02 MED ORDER — VITAMIN D (ERGOCALCIFEROL) 1.25 MG (50000 UNIT) PO CAPS: 50000.0000 [IU] | ORAL_CAPSULE | ORAL | 0 refills | Status: DC

## 2019-08-02 NOTE — Progress Notes (Signed)
Chief Complaint:   OBESITY Yolanda Jordan is here to discuss her progress with her obesity treatment plan along with follow-up of her obesity related diagnoses. Yolanda Jordan is on the Category 2 Plan and keeping a food journal and adhering to recommended goals of 400-500 calories and 35 grams of protein daily and states she is following her eating plan approximately 60% of the time. Yolanda Jordan states she is walking for 10 minutes 2 times per week.  Today's visit was #: 4 Starting weight: 248 lbs Starting date: 06/21/2019 Today's weight: 243 lbs Today's date: 08/02/2019 Total lbs lost to date: 5 Total lbs lost since last in-office visit: 0  Interim History: Yolanda Jordan is eating out at least 4-5 times per week with her family. She is on the plan for breakfast and lunch. She notes it is not an option to decrease dining out. She will be going to the beach on June 23rd on vacation.   Subjective:   1. Vitamin D deficiency Yolanda Jordan's last Vit D was slightly low at 46.5. She is on prescription Vit D.  2. Essential hypertension Yolanda Jordan's blood pressure is well controlled with amlodipine. Cardiovascular ROS: no chest pain or dyspnea on exertion.  BP Readings from Last 3 Encounters:  08/02/19 109/73  07/18/19 123/82  07/05/19 137/79   Lab Results  Component Value Date   CREATININE 0.90 10/13/2011   CREATININE 0.78 08/29/2011   CREATININE 0.60 08/17/2011   Assessment/Plan:   1. Vitamin D deficiency Low Vitamin D level contributes to fatigue and are associated with obesity, breast, and colon cancer. We will refill prescription Vitamin D for 1 month. Yolanda Jordan will follow-up for routine testing of Vitamin D, at least 2-3 times per year to avoid over-replacement.  - Vitamin D, Ergocalciferol, (DRISDOL) 1.25 MG (50000 UNIT) CAPS capsule; Take 1 capsule (50,000 Units total) by mouth every 7 (seven) days.  Dispense: 4 capsule; Refill: 0  2. Essential hypertension Yolanda Jordan will continue amlodipine, and will continue working on  healthy weight loss and exercise to improve blood pressure control. We will watch for signs of hypotension as she continues her lifestyle modifications.   3. Class 2 severe obesity with serious comorbidity and body mass index (BMI) of 37.0 to 37.9 in adult, unspecified obesity type Yolanda Jordan) Yolanda Jordan is currently in the action stage of change. As such, her goal is to continue with weight loss efforts. She has agreed to the Category 2 Plan and keeping a food journal and adhering to recommended goals of 400-500 calories and 35 grams of protein at supper daily.   We discussed making good choices when dining out.  Exercise goals: As is.  Behavioral modification strategies: increasing lean protein intake, decreasing simple carbohydrates, decreasing eating out and travel eating strategies.  Yolanda Jordan has agreed to follow-up with our clinic in 2 weeks. She was informed of the importance of frequent follow-up visits to maximize her success with intensive lifestyle modifications for her multiple health conditions.   Objective:   Blood pressure 109/73, pulse 82, temperature 98 F (36.7 C), temperature source Oral, height 5\' 8"  (1.727 m), weight 243 lb (110.2 kg), SpO2 100 %. Body mass index is 36.95 kg/m.  General: Cooperative, alert, well developed, in no acute distress. HEENT: Conjunctivae and lids unremarkable. Cardiovascular: Regular rhythm.  Lungs: Normal work of breathing. Neurologic: No focal deficits.   Lab Results  Component Value Date   CREATININE 0.90 10/13/2011   BUN 7 10/13/2011   NA 139 10/13/2011   K 4.0 10/13/2011  CL 102 10/13/2011   CO2 26 10/13/2011   Lab Results  Component Value Date   ALT 12 08/29/2011   AST 15 08/29/2011   ALKPHOS 68 08/29/2011   BILITOT 0.1 (L) 08/29/2011   Lab Results  Component Value Date   HGBA1C 5.5 06/21/2019   Lab Results  Component Value Date   INSULIN 10.8 06/21/2019   Lab Results  Component Value Date   TSH 0.948 06/21/2019   Lab  Results  Component Value Date   CHOL 195 06/21/2019   HDL 55 06/21/2019   LDLCALC 119 (H) 06/21/2019   TRIG 117 06/21/2019   Lab Results  Component Value Date   WBC 10.1 12/24/2015   HGB 12.5 12/24/2015   HCT 39.3 12/24/2015   MCV 92.3 12/24/2015   PLT 333 12/24/2015   No results found for: IRON, TIBC, FERRITIN  Obesity Behavioral Intervention Documentation for Insurance:   Approximately 15 minutes were spent on the discussion below.  ASK: We discussed the diagnosis of obesity with Yolanda Jordan today and [name] agreed to give [place] permission to discuss obesity behavioral modification therapy today.  ASSESS: [name] has the diagnosis of obesity and her BMI today is 36.96. [name] is in the action stage of change.   ADVISE: [name] was educated on the multiple health risks of obesity as well as the benefit of weight loss to improve her health. She was advised of the need for long term treatment and the importance of lifestyle modifications to improve her current health and to decrease her risk of future health problems.  AGREE: Multiple dietary modification options and treatment options were discussed and [name] agreed to follow the recommendations documented in the above note.  ARRANGE: [name] was educated on the importance of frequent visits to treat obesity as outlined per CMS and USPSTF guidelines and agreed to schedule her next follow up appointment today.  Attestation Statements:   Reviewed by clinician on day of visit: allergies, medications, problem list, medical history, surgical history, family history, social history, and previous encounter notes.   Wilhemena Durie, am acting as Location manager for [employer], FNP-C.  I have reviewed the above documentation for accuracy and completeness, and I agree with the above. -  Georgianne Fick, FNP

## 2019-08-17 ENCOUNTER — Encounter (INDEPENDENT_AMBULATORY_CARE_PROVIDER_SITE_OTHER): Payer: Self-pay | Admitting: Family Medicine

## 2019-08-17 ENCOUNTER — Ambulatory Visit (INDEPENDENT_AMBULATORY_CARE_PROVIDER_SITE_OTHER): Payer: Medicare HMO | Admitting: Family Medicine

## 2019-08-17 ENCOUNTER — Other Ambulatory Visit: Payer: Self-pay

## 2019-08-17 VITALS — BP 112/74 | HR 68 | Temp 98.0°F | Ht 68.0 in | Wt 241.0 lb

## 2019-08-17 DIAGNOSIS — N1831 Chronic kidney disease, stage 3a: Secondary | ICD-10-CM

## 2019-08-17 DIAGNOSIS — Z6836 Body mass index (BMI) 36.0-36.9, adult: Secondary | ICD-10-CM | POA: Diagnosis not present

## 2019-08-21 ENCOUNTER — Encounter (INDEPENDENT_AMBULATORY_CARE_PROVIDER_SITE_OTHER): Payer: Self-pay | Admitting: Family Medicine

## 2019-08-21 NOTE — Progress Notes (Addendum)
Chief Complaint:   OBESITY Yolanda Jordan is here to discuss her progress with her obesity treatment plan along with follow-up of her obesity related diagnoses. Yolanda Jordan is on the Category 2 Plan and keeping a food journal and adhering to recommended goals of 400-500 calories and 35 grams of protein at supper and states she is following her eating plan approximately 50% of the time. Yolanda Jordan states she is walking for 20 minutes 1-2 times per week.  Today's visit was #: 5 Starting weight: 248 lbs Starting date: 06/21/2019 Today's weight: 241 lbs Today's date: 08/17/2019 Total lbs lost to date: 06/21/2019 Total lbs lost since last in-office visit: 2  Interim History: Yolanda Jordan feels somewhat down about having to change her eating habits. She is however making much better food choices. Her family eats out quite a bit. She feels this is cheaper then cooking at home and she reports her family is not likely to change this habit.  She is skipping lunch at times.  Subjective:   1. Stage 3a chronic kidney disease Yolanda Jordan sees Nephrology for chronic kidney diease. She reports Lithium damaged her kidneys. Lab Results  Component Value Date   CREATININE 0.90 10/13/2011    Assessment/Plan:   1. Stage 3a chronic kidney disease Yolanda Jordan will continue to follow up with Nephrology.   2. Class 2 severe obesity with serious comorbidity and body mass index (BMI) of 36.0 to 36.9 in adult, unspecified obesity type [facility]) Yolanda Jordan is currently in the action stage of change. As such, her goal is to continue with weight loss efforts. She has agreed to the Category 2 Plan.   Exercise goals: As is.  Behavioral modification strategies: increasing lean protein intake and no skipping meals.  Yolanda Jordan has agreed to follow-up with our clinic in 2 to 3 weeks. She was informed of the importance of frequent follow-up visits to maximize her success with intensive lifestyle modifications for her multiple health conditions.   Objective:   Blood  pressure 112/74, pulse 68, temperature 98 F (36.7 C), temperature source Oral, height 5\' 8"  (1.727 m), weight 241 lb (109.3 kg), SpO2 100 %. Body mass index is 36.64 kg/m.  General: Cooperative, alert, well developed, in no acute distress. HEENT: Conjunctivae and lids unremarkable. Cardiovascular: Regular rhythm.  Lungs: Normal work of breathing. Neurologic: No focal deficits.   Lab Results  Component Value Date   CREATININE 0.90 10/13/2011   BUN 7 10/13/2011   NA 139 10/13/2011   K 4.0 10/13/2011   CL 102 10/13/2011   CO2 26 10/13/2011   Lab Results  Component Value Date   ALT 12 08/29/2011   AST 15 08/29/2011   ALKPHOS 68 08/29/2011   BILITOT 0.1 (L) 08/29/2011   Lab Results  Component Value Date   HGBA1C 5.5 06/21/2019   Lab Results  Component Value Date   INSULIN 10.8 06/21/2019   Lab Results  Component Value Date   TSH 0.948 06/21/2019   Lab Results  Component Value Date   CHOL 195 06/21/2019   HDL 55 06/21/2019   LDLCALC 119 (H) 06/21/2019   TRIG 117 06/21/2019   Lab Results  Component Value Date   WBC 10.1 12/24/2015   HGB 12.5 12/24/2015   HCT 39.3 12/24/2015   MCV 92.3 12/24/2015   PLT 333 12/24/2015   No results found for: IRON, TIBC, FERRITIN  Attestation Statements:   Reviewed by clinician on day of visit: allergies, medications, problem list, medical history, surgical history, family history, social history,  and previous encounter notes.   Wilhemena Durie, am acting as Location manager for [employer], FNP-C.  I have reviewed the above documentation for accuracy and completeness, and I agree with the above. -  Georgianne Fick, FNP

## 2019-09-07 ENCOUNTER — Ambulatory Visit (INDEPENDENT_AMBULATORY_CARE_PROVIDER_SITE_OTHER): Payer: Medicare HMO | Admitting: Family Medicine

## 2019-09-14 ENCOUNTER — Encounter (INDEPENDENT_AMBULATORY_CARE_PROVIDER_SITE_OTHER): Payer: Self-pay | Admitting: Family Medicine

## 2019-09-14 ENCOUNTER — Ambulatory Visit (INDEPENDENT_AMBULATORY_CARE_PROVIDER_SITE_OTHER): Payer: Medicare HMO | Admitting: Family Medicine

## 2019-09-14 ENCOUNTER — Other Ambulatory Visit: Payer: Self-pay

## 2019-09-14 VITALS — BP 120/85 | HR 71 | Temp 98.1°F | Ht 68.0 in | Wt 242.0 lb

## 2019-09-14 DIAGNOSIS — R7303 Prediabetes: Secondary | ICD-10-CM

## 2019-09-14 DIAGNOSIS — Z6836 Body mass index (BMI) 36.0-36.9, adult: Secondary | ICD-10-CM

## 2019-09-20 NOTE — Progress Notes (Signed)
Chief Complaint:   OBESITY Yolanda Jordan is here to discuss her progress with her obesity treatment plan along with follow-up of her obesity related diagnoses. Yolanda Jordan is on the Category 2 Plan and states she is following her eating plan approximately 50% of the time. Yolanda Jordan states she is walking for 30 minutes 2 times per week.  Today's visit was #: 6 Starting weight: 248 lbs Starting date: 06/21/2019 Today's weight: 242 lbs Today's date: 09/14/2019 Total lbs lost to date: 6 Total lbs lost since last in-office visit: 0  Interim History: Yolanda Jordan went on vacation recently and she is up 1 lb. She notes her family is still eating out at supper but less than they had been. She does report that she is meal planning some and they are cooking more at home which her family is enjoying. She occasionally skips meals. She does occasionally have sweet tea when dining out.  Subjective:   1. Pre-diabetes Yolanda Jordan has a diagnosis of pre-diabetes based on her elevated Hgb A1c and was informed this puts her at greater risk of developing diabetes. Last A1c was 5.9. She is not on metformin, and she denies polyphagia. She continues to work on diet and exercise to decrease her risk of diabetes.  Lab Results  Component Value Date   HGBA1C 5.5 06/21/2019   Lab Results  Component Value Date   INSULIN 10.8 06/21/2019   Assessment/Plan:   1. Pre-diabetes Yolanda Jordan will continue her meal plan, and will continue to work on weight loss, exercise, and decreasing simple carbohydrates to help decrease the risk of diabetes.   2. Class 2 severe obesity with serious comorbidity and body mass index (BMI) of 36.0 to 36.9 in adult, unspecified obesity type [name]) Yolanda Jordan is currently in the action stage of change. As such, her goal is to continue with weight loss efforts. She has agreed to the Category 2 Plan.   Exercise goals: As is.  Behavioral modification strategies: increasing lean protein intake, decreasing simple carbohydrates,  decreasing liquid calories and meal planning and cooking strategies.  Yolanda Jordan has agreed to follow-up with our clinic in 2 to 3 weeks. She was informed of the importance of frequent follow-up visits to maximize her success with intensive lifestyle modifications for her multiple health conditions.   Objective:   Blood pressure 120/85, pulse 71, temperature 98.1 F (36.7 C), temperature source Oral, height 5\' 8"  (1.727 m), weight 242 lb (109.8 kg), SpO2 99 %. Body mass index is 36.8 kg/m.  General: Cooperative, alert, well developed, in no acute distress. HEENT: Conjunctivae and lids unremarkable. Cardiovascular: Regular rhythm.  Lungs: Normal work of breathing. Neurologic: No focal deficits.   Lab Results  Component Value Date   CREATININE 0.90 10/13/2011   BUN 7 10/13/2011   NA 139 10/13/2011   K 4.0 10/13/2011   CL 102 10/13/2011   CO2 26 10/13/2011   Lab Results  Component Value Date   ALT 12 08/29/2011   AST 15 08/29/2011   ALKPHOS 68 08/29/2011   BILITOT 0.1 (L) 08/29/2011   Lab Results  Component Value Date   HGBA1C 5.5 06/21/2019   Lab Results  Component Value Date   INSULIN 10.8 06/21/2019   Lab Results  Component Value Date   TSH 0.948 06/21/2019   Lab Results  Component Value Date   CHOL 195 06/21/2019   HDL 55 06/21/2019   LDLCALC 119 (H) 06/21/2019   TRIG 117 06/21/2019   Lab Results  Component Value Date  WBC 10.1 12/24/2015   HGB 12.5 12/24/2015   HCT 39.3 12/24/2015   MCV 92.3 12/24/2015   PLT 333 12/24/2015   No results found for: IRON, TIBC, FERRITIN  Attestation Statements:   Reviewed by clinician on day of visit: allergies, medications, problem list, medical history, surgical history, family history, social history, and previous encounter notes.   Wilhemena Durie, am acting as Location manager for [employer], FNP-C.  I have reviewed the above documentation for accuracy and completeness, and I agree with the above. -  Georgianne Fick, FNP

## 2019-10-04 ENCOUNTER — Ambulatory Visit (INDEPENDENT_AMBULATORY_CARE_PROVIDER_SITE_OTHER): Payer: Medicare HMO | Admitting: Family Medicine

## 2019-10-04 ENCOUNTER — Other Ambulatory Visit: Payer: Self-pay

## 2019-10-04 ENCOUNTER — Encounter (INDEPENDENT_AMBULATORY_CARE_PROVIDER_SITE_OTHER): Payer: Self-pay | Admitting: Family Medicine

## 2019-10-04 VITALS — BP 118/78 | HR 75 | Temp 98.2°F | Ht 68.0 in | Wt 243.0 lb

## 2019-10-04 DIAGNOSIS — Z6837 Body mass index (BMI) 37.0-37.9, adult: Secondary | ICD-10-CM

## 2019-10-04 DIAGNOSIS — N1831 Chronic kidney disease, stage 3a: Secondary | ICD-10-CM

## 2019-10-04 DIAGNOSIS — E66812 Obesity, class 2: Secondary | ICD-10-CM

## 2019-10-04 NOTE — Progress Notes (Signed)
Chief Complaint:   OBESITY Yolanda Jordan is here to discuss her progress with her obesity treatment plan along with follow-up of her obesity related diagnoses. Yolanda Jordan is on the Category 2 Plan and states she is following her eating plan approximately 40% of the time. Yolanda Jordan states she is doing 0 minutes 0 times per week.  Today's visit was #: 7 Starting weight: 248 lbs Starting date: 06/21/2019 Today's weight: 243 lbs Today's date: 10/04/2019 Total lbs lost to date: 5 Total lbs lost since last in-office visit: 0  Interim History: Yolanda Jordan has made several positive changes over the last few months. She is eating out less often, cooking more at home, and focusing on protein intake. She does admit to making homemade waffles for dinner a few times recently after purchasing a new Clinical biochemist. She drinks 16 oz of non fat butter milk daily for breakfast.  Subjective:   1. Stage 3a chronic kidney disease Yolanda Jordan sees a Water quality scientist at [facility] for chronic kidney disease. This was caused by lithium which she took for what they thought was bipolar disorder. She has an upcoming appointment with nephrology..  Assessment/Plan:   1. Stage 3a chronic kidney disease Yolanda Jordan will continue to follow up with Nephrology as directed.  2. Class 2 severe obesity with serious comorbidity and body mass index (BMI) of 37.0 to 37.9 in adult, unspecified obesity type [facility]) Yolanda Jordan is currently in the action stage of change. As such, her goal is to continue with weight loss efforts. She has agreed to the Category 2 Plan.   Exercise goals:  She is considering trying water aerobics and I encouraged her to do this. .  Behavioral modification strategies: increasing lean protein intake, decreasing simple carbohydrates and planning for success.  Yolanda Jordan has agreed to follow-up with our clinic in 3 to 4 weeks. She was informed of the importance of frequent follow-up visits to maximize her success with intensive  lifestyle modifications for her multiple health conditions.   Objective:   Blood pressure 118/78, pulse 75, temperature 98.2 F (36.8 C), temperature source Oral, height 5\' 8"  (1.727 m), weight 243 lb (110.2 kg), SpO2 98 %. Body mass index is 36.95 kg/m.  General: Cooperative, alert, well developed, in no acute distress. HEENT: Conjunctivae and lids unremarkable. Cardiovascular: Regular rhythm.  Lungs: Normal work of breathing. Neurologic: No focal deficits.   Lab Results  Component Value Date   CREATININE 0.90 10/13/2011   BUN 7 10/13/2011   NA 139 10/13/2011   K 4.0 10/13/2011   CL 102 10/13/2011   CO2 26 10/13/2011   Lab Results  Component Value Date   ALT 12 08/29/2011   AST 15 08/29/2011   ALKPHOS 68 08/29/2011   BILITOT 0.1 (L) 08/29/2011   Lab Results  Component Value Date   HGBA1C 5.5 06/21/2019   Lab Results  Component Value Date   INSULIN 10.8 06/21/2019   Lab Results  Component Value Date   TSH 0.948 06/21/2019   Lab Results  Component Value Date   CHOL 195 06/21/2019   HDL 55 06/21/2019   LDLCALC 119 (H) 06/21/2019   TRIG 117 06/21/2019   Lab Results  Component Value Date   WBC 10.1 12/24/2015   HGB 12.5 12/24/2015   HCT 39.3 12/24/2015   MCV 92.3 12/24/2015   PLT 333 12/24/2015   No results found for: IRON, TIBC, FERRITIN  Attestation Statements:   Reviewed by clinician on day of visit: allergies, medications, problem  list, medical history, surgical history, family history, social history, and previous encounter notes.   Wilhemena Durie, am acting as Location manager for [employer], FNP-C.  I have reviewed the above documentation for accuracy and completeness, and I agree with the above. -  Georgianne Fick, FNP

## 2019-10-17 DIAGNOSIS — N1831 Chronic kidney disease, stage 3a: Secondary | ICD-10-CM | POA: Diagnosis not present

## 2019-10-19 DIAGNOSIS — Z87891 Personal history of nicotine dependence: Secondary | ICD-10-CM | POA: Diagnosis not present

## 2019-10-19 DIAGNOSIS — N182 Chronic kidney disease, stage 2 (mild): Secondary | ICD-10-CM | POA: Diagnosis not present

## 2019-10-19 DIAGNOSIS — Z6837 Body mass index (BMI) 37.0-37.9, adult: Secondary | ICD-10-CM | POA: Diagnosis not present

## 2019-10-19 DIAGNOSIS — I129 Hypertensive chronic kidney disease with stage 1 through stage 4 chronic kidney disease, or unspecified chronic kidney disease: Secondary | ICD-10-CM | POA: Diagnosis not present

## 2019-10-26 DIAGNOSIS — Z1231 Encounter for screening mammogram for malignant neoplasm of breast: Secondary | ICD-10-CM | POA: Diagnosis not present

## 2019-11-01 ENCOUNTER — Ambulatory Visit (INDEPENDENT_AMBULATORY_CARE_PROVIDER_SITE_OTHER): Payer: Medicare HMO | Admitting: Family Medicine

## 2019-12-29 DIAGNOSIS — G43109 Migraine with aura, not intractable, without status migrainosus: Secondary | ICD-10-CM | POA: Diagnosis not present

## 2019-12-29 DIAGNOSIS — Z23 Encounter for immunization: Secondary | ICD-10-CM | POA: Diagnosis not present

## 2019-12-29 DIAGNOSIS — E559 Vitamin D deficiency, unspecified: Secondary | ICD-10-CM | POA: Diagnosis not present

## 2019-12-29 DIAGNOSIS — I129 Hypertensive chronic kidney disease with stage 1 through stage 4 chronic kidney disease, or unspecified chronic kidney disease: Secondary | ICD-10-CM | POA: Diagnosis not present

## 2019-12-29 DIAGNOSIS — Z1389 Encounter for screening for other disorder: Secondary | ICD-10-CM | POA: Diagnosis not present

## 2019-12-29 DIAGNOSIS — Z1211 Encounter for screening for malignant neoplasm of colon: Secondary | ICD-10-CM | POA: Diagnosis not present

## 2019-12-29 DIAGNOSIS — Z79899 Other long term (current) drug therapy: Secondary | ICD-10-CM | POA: Diagnosis not present

## 2019-12-29 DIAGNOSIS — Z0000 Encounter for general adult medical examination without abnormal findings: Secondary | ICD-10-CM | POA: Diagnosis not present

## 2019-12-29 DIAGNOSIS — N182 Chronic kidney disease, stage 2 (mild): Secondary | ICD-10-CM | POA: Diagnosis not present

## 2019-12-29 DIAGNOSIS — K219 Gastro-esophageal reflux disease without esophagitis: Secondary | ICD-10-CM | POA: Diagnosis not present

## 2019-12-29 DIAGNOSIS — Z6836 Body mass index (BMI) 36.0-36.9, adult: Secondary | ICD-10-CM | POA: Diagnosis not present

## 2019-12-29 DIAGNOSIS — F5104 Psychophysiologic insomnia: Secondary | ICD-10-CM | POA: Diagnosis not present

## 2019-12-29 DIAGNOSIS — Z0001 Encounter for general adult medical examination with abnormal findings: Secondary | ICD-10-CM | POA: Diagnosis not present

## 2019-12-29 DIAGNOSIS — E669 Obesity, unspecified: Secondary | ICD-10-CM | POA: Diagnosis not present

## 2019-12-29 DIAGNOSIS — F319 Bipolar disorder, unspecified: Secondary | ICD-10-CM | POA: Diagnosis not present

## 2019-12-29 DIAGNOSIS — F411 Generalized anxiety disorder: Secondary | ICD-10-CM | POA: Diagnosis not present

## 2020-01-17 DIAGNOSIS — Z85850 Personal history of malignant neoplasm of thyroid: Secondary | ICD-10-CM | POA: Diagnosis not present

## 2020-01-17 DIAGNOSIS — E890 Postprocedural hypothyroidism: Secondary | ICD-10-CM | POA: Diagnosis not present

## 2020-01-17 DIAGNOSIS — Z923 Personal history of irradiation: Secondary | ICD-10-CM | POA: Diagnosis not present

## 2020-01-17 DIAGNOSIS — Z79899 Other long term (current) drug therapy: Secondary | ICD-10-CM | POA: Diagnosis not present

## 2020-03-29 DIAGNOSIS — R102 Pelvic and perineal pain: Secondary | ICD-10-CM | POA: Diagnosis not present

## 2020-03-29 DIAGNOSIS — R0789 Other chest pain: Secondary | ICD-10-CM | POA: Diagnosis not present

## 2020-03-29 DIAGNOSIS — Z01419 Encounter for gynecological examination (general) (routine) without abnormal findings: Secondary | ICD-10-CM | POA: Diagnosis not present

## 2020-03-29 DIAGNOSIS — Z124 Encounter for screening for malignant neoplasm of cervix: Secondary | ICD-10-CM | POA: Diagnosis not present

## 2020-04-05 DIAGNOSIS — R102 Pelvic and perineal pain: Secondary | ICD-10-CM | POA: Diagnosis not present

## 2020-04-22 DIAGNOSIS — N182 Chronic kidney disease, stage 2 (mild): Secondary | ICD-10-CM | POA: Diagnosis not present

## 2020-04-24 DIAGNOSIS — N1831 Chronic kidney disease, stage 3a: Secondary | ICD-10-CM | POA: Diagnosis not present

## 2020-04-24 DIAGNOSIS — I129 Hypertensive chronic kidney disease with stage 1 through stage 4 chronic kidney disease, or unspecified chronic kidney disease: Secondary | ICD-10-CM | POA: Diagnosis not present

## 2020-04-24 DIAGNOSIS — Z6837 Body mass index (BMI) 37.0-37.9, adult: Secondary | ICD-10-CM | POA: Diagnosis not present

## 2020-04-24 DIAGNOSIS — K219 Gastro-esophageal reflux disease without esophagitis: Secondary | ICD-10-CM | POA: Diagnosis not present

## 2020-04-24 DIAGNOSIS — Z87891 Personal history of nicotine dependence: Secondary | ICD-10-CM | POA: Diagnosis not present

## 2020-05-02 DIAGNOSIS — R7303 Prediabetes: Secondary | ICD-10-CM | POA: Diagnosis not present

## 2020-05-02 DIAGNOSIS — I10 Essential (primary) hypertension: Secondary | ICD-10-CM | POA: Diagnosis not present

## 2020-05-02 DIAGNOSIS — N1831 Chronic kidney disease, stage 3a: Secondary | ICD-10-CM | POA: Diagnosis not present

## 2020-05-03 DIAGNOSIS — F411 Generalized anxiety disorder: Secondary | ICD-10-CM | POA: Diagnosis not present

## 2020-05-03 DIAGNOSIS — G8929 Other chronic pain: Secondary | ICD-10-CM | POA: Diagnosis not present

## 2020-05-03 DIAGNOSIS — N1831 Chronic kidney disease, stage 3a: Secondary | ICD-10-CM | POA: Diagnosis not present

## 2020-05-03 DIAGNOSIS — F3341 Major depressive disorder, recurrent, in partial remission: Secondary | ICD-10-CM | POA: Diagnosis not present

## 2020-05-03 DIAGNOSIS — R0789 Other chest pain: Secondary | ICD-10-CM | POA: Diagnosis not present

## 2020-05-03 DIAGNOSIS — M940 Chondrocostal junction syndrome [Tietze]: Secondary | ICD-10-CM | POA: Diagnosis not present

## 2020-05-03 DIAGNOSIS — I129 Hypertensive chronic kidney disease with stage 1 through stage 4 chronic kidney disease, or unspecified chronic kidney disease: Secondary | ICD-10-CM | POA: Diagnosis not present

## 2020-05-03 DIAGNOSIS — K219 Gastro-esophageal reflux disease without esophagitis: Secondary | ICD-10-CM | POA: Diagnosis not present

## 2020-05-30 DIAGNOSIS — R198 Other specified symptoms and signs involving the digestive system and abdomen: Secondary | ICD-10-CM | POA: Diagnosis not present

## 2020-05-30 DIAGNOSIS — R1013 Epigastric pain: Secondary | ICD-10-CM | POA: Diagnosis not present

## 2020-05-30 DIAGNOSIS — K5900 Constipation, unspecified: Secondary | ICD-10-CM | POA: Diagnosis not present

## 2020-06-19 DIAGNOSIS — E890 Postprocedural hypothyroidism: Secondary | ICD-10-CM | POA: Diagnosis not present

## 2020-06-19 DIAGNOSIS — E559 Vitamin D deficiency, unspecified: Secondary | ICD-10-CM | POA: Diagnosis not present

## 2020-06-19 DIAGNOSIS — E538 Deficiency of other specified B group vitamins: Secondary | ICD-10-CM | POA: Diagnosis not present

## 2020-06-20 DIAGNOSIS — E890 Postprocedural hypothyroidism: Secondary | ICD-10-CM | POA: Diagnosis not present

## 2020-06-20 DIAGNOSIS — S8992XA Unspecified injury of left lower leg, initial encounter: Secondary | ICD-10-CM | POA: Diagnosis not present

## 2020-06-20 DIAGNOSIS — Z043 Encounter for examination and observation following other accident: Secondary | ICD-10-CM | POA: Diagnosis not present

## 2020-06-20 DIAGNOSIS — Z6836 Body mass index (BMI) 36.0-36.9, adult: Secondary | ICD-10-CM | POA: Diagnosis not present

## 2020-06-20 DIAGNOSIS — R7303 Prediabetes: Secondary | ICD-10-CM | POA: Diagnosis not present

## 2020-06-20 DIAGNOSIS — Y92812 Truck as the place of occurrence of the external cause: Secondary | ICD-10-CM | POA: Diagnosis not present

## 2020-06-20 DIAGNOSIS — S8990XA Unspecified injury of unspecified lower leg, initial encounter: Secondary | ICD-10-CM | POA: Diagnosis not present

## 2020-06-20 DIAGNOSIS — Z79899 Other long term (current) drug therapy: Secondary | ICD-10-CM | POA: Diagnosis not present

## 2020-06-20 DIAGNOSIS — Z85850 Personal history of malignant neoplasm of thyroid: Secondary | ICD-10-CM | POA: Diagnosis not present

## 2020-06-20 DIAGNOSIS — Z923 Personal history of irradiation: Secondary | ICD-10-CM | POA: Diagnosis not present

## 2020-06-20 DIAGNOSIS — E559 Vitamin D deficiency, unspecified: Secondary | ICD-10-CM | POA: Diagnosis not present

## 2020-06-20 DIAGNOSIS — S8991XA Unspecified injury of right lower leg, initial encounter: Secondary | ICD-10-CM | POA: Diagnosis not present

## 2020-06-20 DIAGNOSIS — W19XXXA Unspecified fall, initial encounter: Secondary | ICD-10-CM | POA: Diagnosis not present

## 2020-06-24 DIAGNOSIS — K648 Other hemorrhoids: Secondary | ICD-10-CM | POA: Diagnosis not present

## 2020-06-24 DIAGNOSIS — Z8719 Personal history of other diseases of the digestive system: Secondary | ICD-10-CM | POA: Diagnosis not present

## 2020-06-24 DIAGNOSIS — K317 Polyp of stomach and duodenum: Secondary | ICD-10-CM | POA: Diagnosis not present

## 2020-06-24 DIAGNOSIS — K640 First degree hemorrhoids: Secondary | ICD-10-CM | POA: Diagnosis not present

## 2020-06-24 DIAGNOSIS — R1013 Epigastric pain: Secondary | ICD-10-CM | POA: Diagnosis not present

## 2020-06-24 DIAGNOSIS — Z86010 Personal history of colonic polyps: Secondary | ICD-10-CM | POA: Diagnosis not present

## 2020-06-24 DIAGNOSIS — K5730 Diverticulosis of large intestine without perforation or abscess without bleeding: Secondary | ICD-10-CM | POA: Diagnosis not present

## 2020-06-24 DIAGNOSIS — K2950 Unspecified chronic gastritis without bleeding: Secondary | ICD-10-CM | POA: Diagnosis not present

## 2020-06-24 DIAGNOSIS — K449 Diaphragmatic hernia without obstruction or gangrene: Secondary | ICD-10-CM | POA: Diagnosis not present

## 2020-06-24 DIAGNOSIS — K3189 Other diseases of stomach and duodenum: Secondary | ICD-10-CM | POA: Diagnosis not present

## 2020-06-24 DIAGNOSIS — Z1211 Encounter for screening for malignant neoplasm of colon: Secondary | ICD-10-CM | POA: Diagnosis not present

## 2020-06-28 DIAGNOSIS — K5900 Constipation, unspecified: Secondary | ICD-10-CM | POA: Diagnosis not present

## 2020-06-28 DIAGNOSIS — R198 Other specified symptoms and signs involving the digestive system and abdomen: Secondary | ICD-10-CM | POA: Diagnosis not present

## 2020-07-03 DIAGNOSIS — I10 Essential (primary) hypertension: Secondary | ICD-10-CM | POA: Diagnosis not present

## 2020-07-03 DIAGNOSIS — R079 Chest pain, unspecified: Secondary | ICD-10-CM | POA: Diagnosis not present

## 2020-07-08 DIAGNOSIS — K760 Fatty (change of) liver, not elsewhere classified: Secondary | ICD-10-CM | POA: Diagnosis not present

## 2020-07-08 DIAGNOSIS — N281 Cyst of kidney, acquired: Secondary | ICD-10-CM | POA: Diagnosis not present

## 2020-07-08 DIAGNOSIS — R1013 Epigastric pain: Secondary | ICD-10-CM | POA: Diagnosis not present

## 2020-07-18 DIAGNOSIS — R079 Chest pain, unspecified: Secondary | ICD-10-CM | POA: Diagnosis not present

## 2020-07-19 DIAGNOSIS — R9431 Abnormal electrocardiogram [ECG] [EKG]: Secondary | ICD-10-CM | POA: Diagnosis not present

## 2020-08-08 DIAGNOSIS — K5900 Constipation, unspecified: Secondary | ICD-10-CM | POA: Diagnosis not present

## 2020-08-08 DIAGNOSIS — R198 Other specified symptoms and signs involving the digestive system and abdomen: Secondary | ICD-10-CM | POA: Diagnosis not present

## 2020-08-16 DIAGNOSIS — R69 Illness, unspecified: Secondary | ICD-10-CM | POA: Diagnosis not present

## 2020-08-26 DIAGNOSIS — R739 Hyperglycemia, unspecified: Secondary | ICD-10-CM | POA: Diagnosis not present

## 2020-08-26 DIAGNOSIS — I2510 Atherosclerotic heart disease of native coronary artery without angina pectoris: Secondary | ICD-10-CM | POA: Diagnosis not present

## 2020-08-26 DIAGNOSIS — I10 Essential (primary) hypertension: Secondary | ICD-10-CM | POA: Diagnosis not present

## 2020-08-26 DIAGNOSIS — K047 Periapical abscess without sinus: Secondary | ICD-10-CM | POA: Diagnosis not present

## 2020-08-26 DIAGNOSIS — R079 Chest pain, unspecified: Secondary | ICD-10-CM | POA: Diagnosis not present

## 2020-08-26 DIAGNOSIS — R42 Dizziness and giddiness: Secondary | ICD-10-CM | POA: Diagnosis not present

## 2020-08-26 DIAGNOSIS — R110 Nausea: Secondary | ICD-10-CM | POA: Diagnosis not present

## 2020-08-26 DIAGNOSIS — R0789 Other chest pain: Secondary | ICD-10-CM | POA: Diagnosis not present

## 2020-08-26 DIAGNOSIS — E785 Hyperlipidemia, unspecified: Secondary | ICD-10-CM | POA: Diagnosis not present

## 2020-08-26 DIAGNOSIS — R519 Headache, unspecified: Secondary | ICD-10-CM | POA: Diagnosis not present

## 2020-08-27 DIAGNOSIS — R079 Chest pain, unspecified: Secondary | ICD-10-CM | POA: Diagnosis not present

## 2020-08-27 DIAGNOSIS — R110 Nausea: Secondary | ICD-10-CM | POA: Diagnosis not present

## 2020-08-27 DIAGNOSIS — I10 Essential (primary) hypertension: Secondary | ICD-10-CM | POA: Diagnosis not present

## 2020-08-27 DIAGNOSIS — R42 Dizziness and giddiness: Secondary | ICD-10-CM | POA: Diagnosis not present

## 2020-08-27 DIAGNOSIS — R519 Headache, unspecified: Secondary | ICD-10-CM | POA: Diagnosis not present

## 2020-08-27 DIAGNOSIS — K047 Periapical abscess without sinus: Secondary | ICD-10-CM | POA: Diagnosis not present

## 2020-08-27 DIAGNOSIS — E669 Obesity, unspecified: Secondary | ICD-10-CM | POA: Diagnosis not present

## 2020-08-27 DIAGNOSIS — I2510 Atherosclerotic heart disease of native coronary artery without angina pectoris: Secondary | ICD-10-CM | POA: Diagnosis not present

## 2020-08-27 DIAGNOSIS — E785 Hyperlipidemia, unspecified: Secondary | ICD-10-CM | POA: Diagnosis not present

## 2020-08-27 DIAGNOSIS — R739 Hyperglycemia, unspecified: Secondary | ICD-10-CM | POA: Diagnosis not present

## 2020-09-05 DIAGNOSIS — I10 Essential (primary) hypertension: Secondary | ICD-10-CM | POA: Diagnosis not present

## 2020-09-12 ENCOUNTER — Ambulatory Visit (HOSPITAL_BASED_OUTPATIENT_CLINIC_OR_DEPARTMENT_OTHER): Admission: RE | Admit: 2020-09-12 | Payer: Self-pay | Source: Home / Self Care | Admitting: Obstetrics and Gynecology

## 2020-09-12 ENCOUNTER — Encounter (HOSPITAL_BASED_OUTPATIENT_CLINIC_OR_DEPARTMENT_OTHER): Admission: RE | Payer: Self-pay | Source: Home / Self Care

## 2020-09-12 SURGERY — HYSTERECTOMY, VAGINAL
Anesthesia: Choice

## 2020-09-17 DIAGNOSIS — R350 Frequency of micturition: Secondary | ICD-10-CM | POA: Diagnosis not present

## 2020-09-17 DIAGNOSIS — N3000 Acute cystitis without hematuria: Secondary | ICD-10-CM | POA: Diagnosis not present

## 2020-09-17 DIAGNOSIS — N1831 Chronic kidney disease, stage 3a: Secondary | ICD-10-CM | POA: Diagnosis not present

## 2020-09-28 DIAGNOSIS — N3001 Acute cystitis with hematuria: Secondary | ICD-10-CM | POA: Diagnosis not present

## 2020-09-28 DIAGNOSIS — R300 Dysuria: Secondary | ICD-10-CM | POA: Diagnosis not present

## 2020-09-29 ENCOUNTER — Emergency Department (HOSPITAL_BASED_OUTPATIENT_CLINIC_OR_DEPARTMENT_OTHER): Payer: Medicare HMO

## 2020-09-29 ENCOUNTER — Other Ambulatory Visit: Payer: Self-pay

## 2020-09-29 ENCOUNTER — Emergency Department (HOSPITAL_BASED_OUTPATIENT_CLINIC_OR_DEPARTMENT_OTHER)
Admission: EM | Admit: 2020-09-29 | Discharge: 2020-09-29 | Disposition: A | Discharge status: Home / Self Care | Payer: Medicare HMO | Attending: Emergency Medicine | Admitting: Emergency Medicine

## 2020-09-29 ENCOUNTER — Encounter (HOSPITAL_BASED_OUTPATIENT_CLINIC_OR_DEPARTMENT_OTHER): Payer: Self-pay | Admitting: Emergency Medicine

## 2020-09-29 DIAGNOSIS — Z79899 Other long term (current) drug therapy: Secondary | ICD-10-CM | POA: Insufficient documentation

## 2020-09-29 DIAGNOSIS — N390 Urinary tract infection, site not specified: Secondary | ICD-10-CM

## 2020-09-29 DIAGNOSIS — N1831 Chronic kidney disease, stage 3a: Secondary | ICD-10-CM

## 2020-09-29 DIAGNOSIS — I129 Hypertensive chronic kidney disease with stage 1 through stage 4 chronic kidney disease, or unspecified chronic kidney disease: Secondary | ICD-10-CM | POA: Diagnosis not present

## 2020-09-29 DIAGNOSIS — E039 Hypothyroidism, unspecified: Secondary | ICD-10-CM | POA: Insufficient documentation

## 2020-09-29 DIAGNOSIS — K219 Gastro-esophageal reflux disease without esophagitis: Secondary | ICD-10-CM | POA: Insufficient documentation

## 2020-09-29 DIAGNOSIS — R1111 Vomiting without nausea: Secondary | ICD-10-CM | POA: Diagnosis not present

## 2020-09-29 DIAGNOSIS — R10A2 Flank pain, left side: Secondary | ICD-10-CM

## 2020-09-29 DIAGNOSIS — R310 Gross hematuria: Secondary | ICD-10-CM | POA: Diagnosis not present

## 2020-09-29 DIAGNOSIS — R109 Unspecified abdominal pain: Secondary | ICD-10-CM

## 2020-09-29 DIAGNOSIS — Z85850 Personal history of malignant neoplasm of thyroid: Secondary | ICD-10-CM | POA: Insufficient documentation

## 2020-09-29 DIAGNOSIS — R1032 Left lower quadrant pain: Secondary | ICD-10-CM | POA: Diagnosis not present

## 2020-09-29 DIAGNOSIS — K7689 Other specified diseases of liver: Secondary | ICD-10-CM | POA: Diagnosis not present

## 2020-09-29 DIAGNOSIS — I878 Other specified disorders of veins: Secondary | ICD-10-CM | POA: Diagnosis not present

## 2020-09-29 LAB — CBC WITH DIFFERENTIAL/PLATELET
Abs Immature Granulocytes: 0.02 10*3/uL (ref 0.00–0.07)
Basophils Absolute: 0 10*3/uL (ref 0.0–0.1)
Basophils Relative: 1 %
Eosinophils Absolute: 0.1 10*3/uL (ref 0.0–0.5)
Eosinophils Relative: 2 %
HCT: 42 % (ref 36.0–46.0)
Hemoglobin: 13.6 g/dL (ref 12.0–15.0)
Immature Granulocytes: 0 %
Lymphocytes Relative: 21 %
Lymphs Abs: 1.3 10*3/uL (ref 0.7–4.0)
MCH: 29.6 pg (ref 26.0–34.0)
MCHC: 32.4 g/dL (ref 30.0–36.0)
MCV: 91.5 fL (ref 80.0–100.0)
Monocytes Absolute: 0.5 10*3/uL (ref 0.1–1.0)
Monocytes Relative: 8 %
Neutro Abs: 4.4 10*3/uL (ref 1.7–7.7)
Neutrophils Relative %: 68 %
Platelets: 286 10*3/uL (ref 150–400)
RBC: 4.59 MIL/uL (ref 3.87–5.11)
RDW: 12.9 % (ref 11.5–15.5)
WBC: 6.4 10*3/uL (ref 4.0–10.5)
nRBC: 0 % (ref 0.0–0.2)

## 2020-09-29 LAB — URINALYSIS, ROUTINE W REFLEX MICROSCOPIC
Bilirubin Urine: NEGATIVE
Glucose, UA: NEGATIVE mg/dL
Ketones, ur: NEGATIVE mg/dL
Leukocytes,Ua: NEGATIVE
Nitrite: NEGATIVE
Protein, ur: NEGATIVE mg/dL
Specific Gravity, Urine: 1.015 (ref 1.005–1.030)
pH: 6 (ref 5.0–8.0)

## 2020-09-29 LAB — COMPREHENSIVE METABOLIC PANEL
ALT: 21 U/L (ref 0–44)
AST: 17 U/L (ref 15–41)
Albumin: 4 g/dL (ref 3.5–5.0)
Alkaline Phosphatase: 68 U/L (ref 38–126)
Anion gap: 7 (ref 5–15)
BUN: 12 mg/dL (ref 6–20)
CO2: 28 mmol/L (ref 22–32)
Calcium: 9.3 mg/dL (ref 8.9–10.3)
Chloride: 104 mmol/L (ref 98–111)
Creatinine, Ser: 1.01 mg/dL — ABNORMAL HIGH (ref 0.44–1.00)
GFR, Estimated: >60 mL/min (ref >60)
Glucose, Bld: 122 mg/dL — ABNORMAL HIGH (ref 70–99)
Potassium: 3.9 mmol/L (ref 3.5–5.1)
Sodium: 139 mmol/L (ref 135–145)
Total Bilirubin: 0.5 mg/dL (ref 0.3–1.2)
Total Protein: 7 g/dL (ref 6.5–8.1)

## 2020-09-29 LAB — URINALYSIS, MICROSCOPIC (REFLEX)

## 2020-09-29 MED ORDER — OXYCODONE HCL 5 MG PO TABS: 5.0000 mg | ORAL_TABLET | Freq: Four times a day (QID) | ORAL | 0 refills | Status: DC | PRN

## 2020-09-29 MED ORDER — ONDANSETRON 4 MG PO TBDP
8.0000 mg | ORAL_TABLET | Freq: Once | ORAL | Status: AC
Start: 2020-09-29 — End: 2020-09-29
  Administered 2020-09-29: 8 mg via ORAL
  Filled 2020-09-29: qty 2

## 2020-09-29 MED ORDER — OXYCODONE-ACETAMINOPHEN 5-325 MG PO TABS
1.0000 | ORAL_TABLET | Freq: Once | ORAL | Status: AC
  Administered 2020-09-29: 1 via ORAL
  Filled 2020-09-29: qty 1

## 2020-09-29 NOTE — ED Triage Notes (Signed)
Pt arrives pov with report of dx of UTI at UC, c/o L flank pain radiating to LLQ since yesterday, reports N/V. Endorses taking abx-Cipro. Pt endorses urinary freq. Pt denies fever. Endorses tylenol at 0430 1 gram

## 2020-09-29 NOTE — ED Notes (Signed)
Patient transported to CT 

## 2020-09-29 NOTE — ED Provider Notes (Signed)
[facility] EMERGENCY DEPARTMENT Provider Note   CSN: [id number] Arrival date & time: 09/29/20  0741     History Chief Complaint  Patient presents with   Flank Pain    Yolanda Jordan is a 55 y.o. female.  Yolanda Jordan went to an outside urgent care on September 17, 2020.  She was diagnosed with a urinary tract infection and placed on Keflex.  Cultures returned positive for E. coli and Klebsiella, and her antibiotic was deemed appropriate based on sensitivities.  Yesterday, she went back, and she again, had a nitrite positive urine sample.  At this point, a culture again was taken, and she was placed on ciprofloxacin.  Overnight, her left flank pain worsened.  Pain is radiating to her left lower abdomen and feels crampy.  She has also had some nausea and vomiting.  She continues to have scant hematuria and frequency.  No fever.  No history of kidney stones.  The history is provided by the patient.  Flank Pain This is a new problem. The current episode started more than 1 week ago. The problem occurs constantly. The problem has been gradually worsening. Associated symptoms include abdominal pain (LLQ). Pertinent negatives include no chest pain, no headaches and no shortness of breath. Nothing aggravates the symptoms. Nothing relieves the symptoms. She has tried acetaminophen for the symptoms. The treatment provided mild relief.      Past Medical History:  Diagnosis Date   Anxiety    Back pain    Bipolar 1 disorder ([place])    Cancer ([place]) 2000   thyroid cancer   Chronic kidney disease, stage 3 (HCC)    Constipation    Depression    Drug use    Dyspnea    panic attacks, high humidity   GERD (gastroesophageal reflux disease)    GERD (gastroesophageal reflux disease)    Headache    Migraines   High blood pressure    Hx of thyroid cancer    Hypothyroidism    Joint pain    Memory loss    PONV (postoperative nausea and vomiting)    Sleep apnea    SOB (shortness of breath)     Thyroid disease    Vitamin D deficiency     Patient Active Problem List   Diagnosis Date Noted   Insulin resistance 07/18/2019   Class 2 severe obesity with serious comorbidity and body mass index (BMI) of 37.0 to 37.9 in adult ([place]) 06/22/2019   Stage 3a chronic kidney disease ([place]) 05/09/2019   Essential hypertension 01/12/2018   Myopia with astigmatism and presbyopia, bilateral 07/03/2016   Family history of colonic polyps 06/30/2016   Gastroesophageal reflux disease without esophagitis 06/30/2016   Juvenile polyposis syndrome 06/30/2016   Prediabetes 09/24/2015   OSA (obstructive sleep apnea) 08/06/2015   Vitamin D deficiency 03/13/2015   H/O radioactive iodine thyroid ablation 12/03/2013   Migraine with aura and without status migrainosus, not intractable 05/10/2013   Bipolar 1 disorder, depressed ([place]) 11/04/2012   Hypothyroidism 11/04/2012   History of thyroid cancer 07/28/2011    Past Surgical History:  Procedure Laterality Date   APPENDECTOMY     BLADDER SUSPENSION N/A 01/03/2016   Procedure: TRANSVAGINAL TAPE (TVT) PROCEDURE;  Surgeon: Cheri Fowler, MD;  Location: [facility];  Service: Gynecology;  Laterality: N/A;   CESAREAN SECTION     CYSTOSCOPY N/A 01/03/2016   Procedure: CYSTOSCOPY;  Surgeon: Cheri Fowler, MD;  Location: [facility];  Service: Gynecology;  Laterality: N/A;  HEMORROIDECTOMY     age 76   PERINEOPLASTY N/A 01/03/2016   Procedure: PERINEOPLASTY;  Surgeon: Cheri Fowler, MD;  Location: [facility];  Service: Gynecology;  Laterality: N/A;   THYROIDECTOMY     TUBAL LIGATION     uterine ablation     05/2010   WISDOM TOOTH EXTRACTION       OB History     Gravida  5   Para  5   Term      Preterm      AB      Living         SAB      IAB      Ectopic      Multiple      Live Births              Family History  Problem Relation Age of Onset   Healthy Mother    Stroke Mother    Depression Mother    Hypertension Father    Diabetes  Father    Renal Disease Father    High Cholesterol Father    Heart disease Father    Kidney disease Father    Anxiety disorder Father    Bipolar disorder Father    Sleep apnea Father    Obesity Father     Social History   Tobacco Use   Smoking status: Never   Smokeless tobacco: Never  Substance Use Topics   Alcohol use: Yes    Comment: every couple days   Drug use: Yes    Types: Marijuana    Comment: occ    Home Medications Prior to Admission medications   Medication Sig Start Date End Date Taking? Authorizing Provider  ALPRAZolam Duanne Moron) 1 MG tablet Take 1 mg by mouth See admin instructions. Up to 6 x daily prn for anxiety    [provider]  amLODipine (NORVASC) 2.5 MG tablet Take 2.5 mg by mouth daily.    [provider]  brexpiprazole (REXULTI) 1 MG TABS tablet Take 1 mg by mouth daily.    [provider]  buPROPion (WELLBUTRIN XL) 150 MG 24 hr tablet Take 150 mg by mouth daily. Three tablets once per day    [provider]  estradiol (VIVELLE-DOT) 0.075 MG/24HR Place 1 patch onto the skin 2 (two) times a week.    [provider]  levothyroxine (SYNTHROID, LEVOTHROID) 137 MCG tablet Take 137 mcg by mouth daily.    [provider]  Melatonin 5 MG CAPS Take by mouth. 1 qhs    [provider]  pantoprazole (PROTONIX) 40 MG tablet Take 40 mg by mouth daily.    [provider]  Polyethyl Glycol-Propyl Glycol (SYSTANE) 0.4-0.3 % SOLN Apply 1 drop to eye daily as needed (for dry eye).    [provider]  progesterone (PROMETRIUM) 100 MG capsule Take 100 mg by mouth daily. At bedtime 07/14/19   [provider]  rizatriptan (MAXALT) 10 MG tablet Take 10 mg by mouth every 2 (two) hours as needed for migraine. 10/16/15   [provider]  Vitamin D, Ergocalciferol, (DRISDOL) 1.25 MG (50000 UNIT) CAPS capsule Take 1 capsule (50,000 Units total) by mouth every 7 (seven) days. 08/02/19    Whitmire, Joneen Boers, FNP    Allergies    Morphine and related  Review of Systems   Review of Systems  Constitutional:  Negative for chills and fever.  HENT:  Negative for ear pain and sore throat.  Eyes:  Negative for pain and visual disturbance.  Respiratory:  Negative for cough and shortness of breath.   Cardiovascular:  Negative for chest pain and palpitations.  Gastrointestinal:  Positive for abdominal pain (LLQ). Negative for vomiting.  Genitourinary:  Positive for flank pain. Negative for dysuria and hematuria.  Musculoskeletal:  Negative for arthralgias and back pain.  Skin:  Negative for color change and rash.  Neurological:  Negative for seizures, syncope and headaches.  All other systems reviewed and are negative.  Physical Exam Updated Vital Signs BP 127/90 (BP Location: Right Arm)   Pulse 79   Temp 97.8 F (36.6 C) (Oral)   Resp 20   Ht '5\' 9"'$  (1.753 m)   Wt 106.6 kg   SpO2 97%   BMI 34.70 kg/m   Physical Exam Vitals and nursing note reviewed.  HENT:     Head: Normocephalic and atraumatic.  Eyes:     General: No scleral icterus. Pulmonary:     Effort: Pulmonary effort is normal. No respiratory distress.  Abdominal:     General: There is no distension.     Tenderness: There is no abdominal tenderness. There is left CVA tenderness.  Musculoskeletal:     Cervical back: Normal range of motion.  Skin:    General: Skin is warm and dry.  Neurological:     General: No focal deficit present.     Mental Status: She is alert and oriented to person, place, and time.  Psychiatric:        Mood and Affect: Mood normal.    ED Results / Procedures / Treatments   Labs (all labs ordered are listed, but only abnormal results are displayed) Labs Reviewed  COMPREHENSIVE METABOLIC PANEL - Abnormal; Notable for the following components:      Result Value   Glucose, Bld 122 (*)    Creatinine, Ser 1.01 (*)    All other components within normal limits  URINALYSIS,  ROUTINE W REFLEX MICROSCOPIC - Abnormal; Notable for the following components:   Hgb urine dipstick SMALL (*)    All other components within normal limits  URINALYSIS, MICROSCOPIC (REFLEX) - Abnormal; Notable for the following components:   Bacteria, UA RARE (*)    All other components within normal limits  CBC WITH DIFFERENTIAL/PLATELET    EKG None  Radiology CT Renal Stone Study  Result Date: 09/29/2020 CLINICAL DATA:  55 year old female with recent UTIs. Gross hematuria. Left flank pain. Burning. Nausea vomiting. EXAM: CT ABDOMEN AND PELVIS WITHOUT CONTRAST TECHNIQUE: Multidetector CT imaging of the abdomen and pelvis was performed following the standard protocol without IV contrast. COMPARISON:  Abdomen ultrasound 07/08/2020. FINDINGS: Lower chest: Negative. Hepatobiliary: Small circumscribed low-density area in the left hepatic lobe on series 2, image 19 is compatible with tiny benign hepatic cyst. Otherwise negative noncontrast liver. Negative gallbladder. Pancreas: Negative. Spleen: Negative. Adrenals/Urinary Tract: Normal adrenal glands. No nephrolithiasis. Small right renal lower pole cyst redemonstrated. No hydronephrosis, and both ureters are decompressed to the bladder. Tiny pelvic phlebolith on series 2, image 70. No urinary calculus. Diminutive and unremarkable bladder. Stomach/Bowel: Redundant large bowel with mild to moderate retained stool. Diminutive or absent appendix. No large bowel inflammation. Negative terminal ileum. No dilated small bowel. Negative stomach and duodenum. No free air, free fluid, mesenteric inflammation. Vascular/Lymphatic: Normal caliber abdominal aorta. No calcified atherosclerosis. No lymphadenopathy. Reproductive: Negative noncontrast appearance. Other: No pelvic free fluid. Musculoskeletal: Lower lumbar facet degeneration. No acute osseous abnormality identified. IMPRESSION: No urinary calculus or obstructive uropathy.  No acute or inflammatory process  identified in the non-contrast abdomen or pelvis. Electronically Signed   By: Genevie Ann M.D.   On: 09/29/2020 09:23    Procedures Procedures   Medications Ordered in ED Medications  oxyCODONE-acetaminophen (PERCOCET/ROXICET) 5-325 MG per tablet 1 tablet (has no administration in time range)  ondansetron (ZOFRAN-ODT) disintegrating tablet 8 mg (has no administration in time range)    ED Course  I have reviewed the triage vital signs and the nursing notes.  Pertinent labs & imaging results that were available during my care of the patient were reviewed by me and considered in my medical decision making (see chart for details).    MDM Rules/Calculators/A&P                           Yolanda Jordan has had 2 urinary tract infections since July 19.  She is currently on Cipro after going to urgent care yesterday.  She developed worse left-sided flank pain and nausea/vomiting.  Here in the ED vital signs are normal.  She has some mild left CVA tenderness.  Urinalysis is much improved from yesterday and shows only a small amount of blood.  Labs are within normal limits especially in the setting of her known diagnosis of chronic kidney disease.  CT was unremarkable.  She was counseled that she should continue her current treatment.  A small course of pain medication was ordered, and she was counseled on the side effects especially constipation.  She was advised to use this sparingly.  We talked about the possibility of urologic follow-up especially if she continues to have frequent infections.  She does have nephrology follow-up upcoming in several weeks. Final Clinical Impression(s) / ED Diagnoses Final diagnoses:  Left flank pain  Lower urinary tract infectious disease  Stage 3a chronic kidney disease ([place])    Rx / DC Orders ED Discharge Orders          Ordered    oxyCODONE (ROXICODONE) 5 MG immediate release tablet  Every 6 hours PRN        09/29/20 0934             Arnaldo Natal,  MD 09/29/20 [phone]

## 2021-10-08 ENCOUNTER — Encounter (INDEPENDENT_AMBULATORY_CARE_PROVIDER_SITE_OTHER): Payer: Self-pay

## 2021-11-22 ENCOUNTER — Emergency Department (HOSPITAL_BASED_OUTPATIENT_CLINIC_OR_DEPARTMENT_OTHER)
Admission: EM | Admit: 2021-11-22 | Discharge: 2021-11-22 | Disposition: A | Discharge status: Home / Self Care | Payer: Medicare HMO | Attending: Emergency Medicine | Admitting: Emergency Medicine

## 2021-11-22 ENCOUNTER — Other Ambulatory Visit: Payer: Self-pay

## 2021-11-22 ENCOUNTER — Emergency Department (HOSPITAL_BASED_OUTPATIENT_CLINIC_OR_DEPARTMENT_OTHER): Payer: Medicare HMO

## 2021-11-22 ENCOUNTER — Encounter (HOSPITAL_BASED_OUTPATIENT_CLINIC_OR_DEPARTMENT_OTHER): Payer: Self-pay | Admitting: Emergency Medicine

## 2021-11-22 DIAGNOSIS — J069 Acute upper respiratory infection, unspecified: Secondary | ICD-10-CM | POA: Diagnosis not present

## 2021-11-22 DIAGNOSIS — R059 Cough, unspecified: Secondary | ICD-10-CM | POA: Diagnosis present

## 2021-11-22 DIAGNOSIS — Z79899 Other long term (current) drug therapy: Secondary | ICD-10-CM | POA: Insufficient documentation

## 2021-11-22 DIAGNOSIS — U071 COVID-19: Secondary | ICD-10-CM | POA: Diagnosis not present

## 2021-11-22 DIAGNOSIS — E039 Hypothyroidism, unspecified: Secondary | ICD-10-CM | POA: Insufficient documentation

## 2021-11-22 LAB — RESP PANEL BY RT-PCR (FLU A&B, COVID) ARPGX2
Influenza A by PCR: NEGATIVE
Influenza B by PCR: NEGATIVE
SARS Coronavirus 2 by RT PCR: POSITIVE — AB

## 2021-11-22 MED ORDER — KETOROLAC TROMETHAMINE 60 MG/2ML IM SOLN
60.0000 mg | Freq: Once | INTRAMUSCULAR | Status: AC
  Administered 2021-11-22: 60 mg via INTRAMUSCULAR
  Filled 2021-11-22: qty 2

## 2021-11-22 MED ORDER — NIRMATRELVIR/RITONAVIR (PAXLOVID)TABLET: 3.0000 | ORAL_TABLET | Freq: Two times a day (BID) | ORAL | 0 refills | Status: AC

## 2021-11-22 MED ORDER — ONDANSETRON 4 MG PO TBDP: 4.0000 mg | ORAL_TABLET | Freq: Three times a day (TID) | ORAL | 0 refills | Status: DC | PRN

## 2021-11-22 NOTE — ED Triage Notes (Signed)
Pt reports she tested positive for Covid yesterday. Requesting medication to help with with symptoms. Husband also recently had Covid.

## 2021-11-22 NOTE — ED Provider Notes (Signed)
[facility] Provider Note   CSN: [id number] Arrival date & time: 11/22/21  6213     History  Chief Complaint  Patient presents with   Cough    Yolanda Jordan is a 56 y.o. female.  With PMH of obesity, bipolar disorder, hypothyroidism, GERD who presents with cough in the setting of testing positive for COVID at home yesterday.  Her husband also recently had COVID.  She is requesting medication to help with her symptoms.  She said her husband took Paxlovid and felt much better quickly.  She has had some nausea but no vomiting and uses Zofran at home.  She has had a dry cough and body aches.  No chest pain, no shortness of breath, no hemoptysis.  No fainting and no history of lung disease.  She does take a statin.   Cough      Home Medications Prior to Admission medications   Medication Sig Start Date End Date Taking? Authorizing Provider  nirmatrelvir/ritonavir EUA (PAXLOVID) 20 x 150 MG & 10 x '100MG'$  TABS Take 3 tablets by mouth 2 (two) times daily for 5 days. Patient GFR is >60. Take nirmatrelvir (150 mg) two tablets twice daily for 5 days and ritonavir (100 mg) one tablet twice daily for 5 days. 11/22/21 11/27/21 Yes Elgie Congo, MD  ondansetron (ZOFRAN-ODT) 4 MG disintegrating tablet Take 1 tablet (4 mg total) by mouth every 8 (eight) hours as needed for nausea or vomiting. 11/22/21  Yes Elgie Congo, MD  ALPRAZolam Duanne Moron) 1 MG tablet Take 1 mg by mouth See admin instructions. Up to 6 x daily prn for anxiety    [provider]  amLODipine (NORVASC) 2.5 MG tablet Take 2.5 mg by mouth daily.    [provider]  brexpiprazole (REXULTI) 1 MG TABS tablet Take 1 mg by mouth daily.    [provider]  buPROPion (WELLBUTRIN XL) 150 MG 24 hr tablet Take 150 mg by mouth daily. Three tablets once per day    [provider]  estradiol (VIVELLE-DOT) 0.075 MG/24HR Place 1 patch onto the skin 2 (two) times a week.     [provider]  levothyroxine (SYNTHROID, LEVOTHROID) 137 MCG tablet Take 137 mcg by mouth daily.    [provider]  Melatonin 5 MG CAPS Take by mouth. 1 qhs    [provider]  oxyCODONE (ROXICODONE) 5 MG immediate release tablet Take 1 tablet (5 mg total) by mouth every 6 (six) hours as needed for up to 6 doses for severe pain. 09/29/20   Arnaldo Natal, MD  pantoprazole (PROTONIX) 40 MG tablet Take 40 mg by mouth daily.    [provider]  Polyethyl Glycol-Propyl Glycol (SYSTANE) 0.4-0.3 % SOLN Apply 1 drop to eye daily as needed (for dry eye).    [provider]  progesterone (PROMETRIUM) 100 MG capsule Take 100 mg by mouth daily. At bedtime 07/14/19   [provider]  rizatriptan (MAXALT) 10 MG tablet Take 10 mg by mouth every 2 (two) hours as needed for migraine. 10/16/15   [provider]  Vitamin D, Ergocalciferol, (DRISDOL) 1.25 MG (50000 UNIT) CAPS capsule Take 1 capsule (50,000 Units total) by mouth every 7 (seven) days. 08/02/19   [place], Joneen Boers, FNP      Allergies    Ciprofloxacin and Morphine and related    Review of Systems   Review of Systems  Respiratory:  Positive for cough.  Physical Exam Updated Vital Signs BP 123/89   Pulse 94   Temp 98.3 F (36.8 C) (Oral)   Resp 16   SpO2 97%  Physical Exam Constitutional: Alert and oriented. Well appearing and in no distress. Eyes: Conjunctivae are normal. ENT      Head: Normocephalic and atraumatic.      Nose: No congestion.      Mouth/Throat: Mucous membranes are moist.      Neck: No stridor. Cardiovascular: S1, S2, regular rate, warm and well-perfused Respiratory: Normal respiratory effort. Breath sounds are normal.  O2 sat 97 on RA Gastrointestinal: Soft and nontender.  Musculoskeletal: Normal range of motion in all extremities. Neurologic: Normal speech and language. No gross focal neurologic deficits are appreciated. Skin: Skin is warm, dry and  intact. No rash noted. Psychiatric: Mood and affect are normal. Speech and behavior are normal.  ED Results / Procedures / Treatments   Labs (all labs ordered are listed, but only abnormal results are displayed) Labs Reviewed  RESP PANEL BY RT-PCR (FLU A&B, COVID) [id number]    EKG None  Radiology DG Chest Portable 1 View  Result Date: 11/22/2021 CLINICAL DATA:  56 year old female positive for COVID-19. EXAM: PORTABLE CHEST 1 VIEW COMPARISON:  Chest radiographs 10/13/2011. FINDINGS: Portable AP semi upright view at 0720 hours. Lung volumes and mediastinal contours remain normal. Visualized tracheal air column is within normal limits. Allowing for portable technique the lungs are clear. No pneumothorax or pleural effusion. No osseous abnormality identified. Paucity of bowel gas in the upper abdomen. IMPRESSION: Negative portable chest. Electronically Signed   By: Genevie Ann M.D.   On: 11/22/2021 07:36    Procedures Procedures    Medications Ordered in ED Medications  ketorolac (TORADOL) injection 60 mg (60 mg Intramuscular Given 11/22/21 1610)    ED Course/ Medical Decision Making/ A&P                           Medical Decision Making  Yolanda Jordan is a 56 y.o. female.  With PMH of obesity, bipolar disorder, hypothyroidism, GERD who presents with cough in the setting of testing positive for COVID at home yesterday.    Overall, this is a well appearing patient presenting with mild viral-like symptoms, secondary to COVID, influenza, or any other non-specific virus specially given high prevalence of disease in surrounding community.  We have reswab for COVID/influenza however patient did test positive for COVID at home.  Vitals as documented. On exam, normal work of breathing. Speaking in full sentences without difficulty. No respiratory distress and overall non-toxic appearing. No evidence of hypoxia.  Based on the patient's medical screening exam, including their history, vitals, and  physical exam, the patient is stable for further outpatient evaluation of their symptoms. There is no evidence of respiratory distress, systemic toxicity, hemodynamic compromise, or emergent medical condition.  Therefore, I do not feel any further imaging or laboratory work is required beyond the viral swabs or CXR which I ordered.  Regarding my thought process, this patient has signs and symptoms consistent with a viral syndrome. There is no evidence to suggest serious bacterial infection at this time, including bacteremia. There is no stridor or difficulty handling secretions to suggest a severe bacterial upper respiratory illness. Signs, symptoms and clinical appearance are not consistent with meningitis or encephalitis. No risk factors for bacterial endocarditis (like IVDU, indwelling catheter, prosthetic heart valve, ESRD), and no known history of valvular abnormality. At this time  I do not believe this to be pericarditis or myocarditis given predominant respiratory symptoms and no signs of fluid overload.   A chest x-ray was obtained which showed no evidence of superimposed bacterial pneumonia, no pleural effusion, no pneumothorax.   I will discharge patient with prescription for Zofran and Paxlovid per their request.  I did discuss the common side effects of Paxlovid and interactions and how she will likely need to discontinue statin and discuss with pharmacist any other medication interactions. I discussed strict return precautions with them, which were included in my discharge instructions, and they understand and agree to come back to the Emergency Department if their symptoms are persistent for a repeat exam in 8-12 hrs, or sooner if things change/worsen. They express understanding, and patient with discharged in stable condition.    Amount and/or Complexity of Data Reviewed Radiology: ordered.  Risk Prescription drug management.    Final Clinical Impression(s) / ED Diagnoses Final  diagnoses:  Viral URI with cough  COVID-19    Rx / DC Orders ED Discharge Orders          Ordered    ondansetron (ZOFRAN-ODT) 4 MG disintegrating tablet  Every 8 hours PRN        11/22/21 0800    nirmatrelvir/ritonavir EUA (PAXLOVID) 20 x 150 MG & 10 x '100MG'$  TABS  2 times daily        11/22/21 0800              Elgie Congo, MD 11/22/21 [phone]

## 2021-11-22 NOTE — Discharge Instructions (Signed)
You were seen in the emergency department today for symptoms related to COVID infection.  Thankfully, your exam and x-ray were reassuring.  There was no evidence of pneumonia and you are safe to be discharged home at this time.  Take Tylenol and ibuprofen as needed for pain and fever.  Make sure to drink plenty of fluids.  Take the Zofran as needed for nausea or vomiting.  Take the Paxlovid as prescribed for COVID infection.  As we discussed, you will likely have to discontinue your statin while taking this medicine and discuss with the pharmacist any other medication interactions you may have.  Follow-up with your doctor regarding your visit to the ER today.  Come back if you have any severe worsening cough, worsening shortness of breath, chest pain, fainting, inability to keep food or fluids down or any other symptoms concerning to you.

## 2021-12-08 ENCOUNTER — Ambulatory Visit: Payer: Medicare HMO | Admitting: Sports Medicine

## 2021-12-08 VITALS — BP 132/78 | HR 65 | Ht 69.0 in | Wt 207.0 lb

## 2021-12-08 DIAGNOSIS — S46812A Strain of other muscles, fascia and tendons at shoulder and upper arm level, left arm, initial encounter: Secondary | ICD-10-CM

## 2021-12-08 DIAGNOSIS — M542 Cervicalgia: Secondary | ICD-10-CM

## 2021-12-08 MED ORDER — METHYLPREDNISOLONE 4 MG PO TBPK: ORAL_TABLET | ORAL | 0 refills | Status: DC

## 2021-12-08 MED ORDER — CYCLOBENZAPRINE HCL 5 MG PO TABS: 5.0000 mg | ORAL_TABLET | Freq: Three times a day (TID) | ORAL | 0 refills | Status: DC | PRN

## 2021-12-08 NOTE — Patient Instructions (Addendum)
Good to see you  Prednisone dos pak  Flexeril 5-10 mg nightly as needed for muscle spasm  Neck HEP  3 week follow up

## 2021-12-08 NOTE — Progress Notes (Signed)
Yolanda Jordan D.[place] Phone: [phone]   Assessment and Plan:     1. Neck pain 2. Strain of left trapezius muscle, initial encounter  -Acute, uncomplicated, initial sports medicine visit - Consistent with strain of cervical portion of left trapezius based on HPI, physical exam - Start prednisone Dosepak - Patient states she has been advised to not take NSAIDs due to history of CKD, however looking back 1 year patient's creatinine has been >60.  Regardless, at this time we will hold off on NSAIDs - May use Tylenol for breakthrough pain - Start Flexeril 5 to 10 mg as needed for muscle spasms - Start HEP for neck and trapezius  Pertinent previous records reviewed include creatinine 09/29/2020, GFR 09/29/2020, GFR 2013, urgent care note 11/22/2021   Follow Up: 3 weeks for reevaluation.  Could consider trigger point injections versus OMT versus x-ray based on patient's symptoms   Subjective:   I, Moenique Parris, am serving as a Education administrator for Doctor Glennon Mac  Chief Complaint: neck pain   HPI:   12/08/21 Patient is a 56 year old female complaining of neck pain. Patient states that she was painting a ceiling and now she cant put it back she has decreased ROM , no radiating pain, took ib for the pain and it helped a little not enough though , tylenol doesn't touch it , no numbness or tingling , has used ice and heat and voltaren gel and doesn't help   Relevant Historical Information: Hypertension  Additional pertinent review of systems negative.   Current Outpatient Medications:    ALPRAZolam (XANAX) 1 MG tablet, Take 1 mg by mouth See admin instructions. Up to 6 x daily prn for anxiety, Disp: , Rfl:    amLODipine (NORVASC) 2.5 MG tablet, Take 2.5 mg by mouth daily., Disp: , Rfl:    brexpiprazole (REXULTI) 1 MG TABS tablet, Take 1 mg by mouth daily., Disp: , Rfl:    buPROPion (WELLBUTRIN XL) 150 MG  24 hr tablet, Take 150 mg by mouth daily. Three tablets once per day, Disp: , Rfl:    estradiol (VIVELLE-DOT) 0.075 MG/24HR, Place 1 patch onto the skin 2 (two) times a week., Disp: , Rfl:    levothyroxine (SYNTHROID, LEVOTHROID) 137 MCG tablet, Take 137 mcg by mouth daily., Disp: , Rfl:    Melatonin 5 MG CAPS, Take by mouth. 1 qhs, Disp: , Rfl:    ondansetron (ZOFRAN-ODT) 4 MG disintegrating tablet, Take 1 tablet (4 mg total) by mouth every 8 (eight) hours as needed for nausea or vomiting., Disp: 20 tablet, Rfl: 0   oxyCODONE (ROXICODONE) 5 MG immediate release tablet, Take 1 tablet (5 mg total) by mouth every 6 (six) hours as needed for up to 6 doses for severe pain., Disp: 30 tablet, Rfl: 0   pantoprazole (PROTONIX) 40 MG tablet, Take 40 mg by mouth daily., Disp: , Rfl:    Polyethyl Glycol-Propyl Glycol (SYSTANE) 0.4-0.3 % SOLN, Apply 1 drop to eye daily as needed (for dry eye)., Disp: , Rfl:    progesterone (PROMETRIUM) 100 MG capsule, Take 100 mg by mouth daily. At bedtime, Disp: , Rfl:    rizatriptan (MAXALT) 10 MG tablet, Take 10 mg by mouth every 2 (two) hours as needed for migraine., Disp: , Rfl: 3   Vitamin D, Ergocalciferol, (DRISDOL) 1.25 MG (50000 UNIT) CAPS capsule, Take 1 capsule (50,000 Units total) by mouth every 7 (seven) days., Disp: 4  capsule, Rfl: 0   Objective:     Vitals:   12/08/21 1531  BP: 132/78  Pulse: 65  SpO2: 97%  Weight: 235 lb (106.6 kg)  Height: '5\' 9"'$  (1.753 m)      Body mass index is 34.7 kg/m.    Physical Exam:    Cervical Spine: Posture normal Skin: normal, intact  Neurological:   Strength:  Right  Left   Deltoid 5/5 5/5  Bicep 5/5  5/5  Tricep 5/5 5/5  Wrist Flexion 5/5 5/5  Wrist Extension 5/5 5/5  Grip 5/5 5/5  Finger Abduction 5/5 5/5   Sensation: intact to light touch in upper extremities bilaterally  Spurling's:  negative bilaterally Neck ROM: Active ROM restricted in all directions with left-sided neck pain TTP: Left cervical  paraspinal, left trapezius NTTP: cervical spinous processes, right cervical paraspinal, thoracic paraspinal, right trapezius    Electronically signed by:  Yolanda Jordan D.[facility] 3:50 PM 12/08/21

## 2021-12-25 NOTE — Progress Notes (Signed)
Yolanda Jordan D.[place] Phone: [phone]   Assessment and Plan:     1. Neck pain 2. Strain of left trapezius muscle, initial encounter -Acute, uncomplicated, subsequent visit - Essentially unchanged symptoms still consistent with strain of cervical portion of left-sided trapezius that did not improve with HEP and prednisone Dosepak - Discussed with patient that her creatinine and GFR have been WNL for >1-year and that she should be able to tolerate a short course of NSAIDs.  Patient agreeable to trialing NSAID course - Start meloxicam 15 mg daily x2 weeks.  If still having pain after 2 weeks, complete 3rd-week of meloxicam. May use remaining meloxicam as needed once daily for pain control.  Do not to use additional NSAIDs while taking meloxicam.  May use Tylenol [phone] mg 2 to 3 times a day for breakthrough pain. - Discontinue Flexeril as it was not beneficial - Continue HEP neck and trapezius - Patient has received significant relief with OMT in the past.  Elects for repeat OMT today.  Tolerated well per note below. - Decision today to treat with OMT was based on Physical Exam  After verbal consent patient was treated with HVLA (high velocity low amplitude), ME (muscle energy), FPR (flex positional release), ST (soft tissue),  techniques in cervical, rib, thoracic, areas. Patient tolerated the procedure well with improvement in symptoms.  Patient educated on potential side effects of soreness and recommended to rest, hydrate, and use Tylenol as needed for pain control.  Other orders - meloxicam (MOBIC) 15 MG tablet; Take 1 tablet (15 mg total) by mouth daily.    Pertinent previous records reviewed include creatinine 09/29/2020, GFR 09/29/2020, GFR 2013   Follow Up: 3 weeks for reevaluation.  If no improvement or worsening of symptoms would obtain C-spine x-ray.  Could consider repeat OMT versus physical  therapy versus trigger point injections based on symptoms   Subjective:   I, Yolanda Jordan, am serving as a Education administrator for Doctor Glennon Mac   Chief Complaint: neck pain    HPI:    12/08/21 Patient is a 56 year old female complaining of neck pain. Patient states that she was painting a ceiling and now she cant put it back she has decreased ROM , no radiating pain, took ib for the pain and it helped a little not enough though , tylenol doesn't touch it , no numbness or tingling , has used ice and heat and voltaren gel and doesn't help   12/26/2021 Patient states that she is the same , is using a TENS unit    Relevant Historical Information: Hypertension  Additional pertinent review of systems negative.   Current Outpatient Medications:    meloxicam (MOBIC) 15 MG tablet, Take 1 tablet (15 mg total) by mouth daily., Disp: 30 tablet, Rfl: 0   ALPRAZolam (XANAX) 1 MG tablet, Take 1 mg by mouth See admin instructions. Up to 6 x daily prn for anxiety, Disp: , Rfl:    amLODipine (NORVASC) 2.5 MG tablet, Take 2.5 mg by mouth daily., Disp: , Rfl:    brexpiprazole (REXULTI) 1 MG TABS tablet, Take 1 mg by mouth daily., Disp: , Rfl:    buPROPion (WELLBUTRIN XL) 150 MG 24 hr tablet, Take 150 mg by mouth daily. Three tablets once per day, Disp: , Rfl:    cyclobenzaprine (FLEXERIL) 5 MG tablet, Take 1 tablet (5 mg total) by mouth 3 (three) times daily as needed for muscle  spasms., Disp: 30 tablet, Rfl: 0   estradiol (VIVELLE-DOT) 0.075 MG/24HR, Place 1 patch onto the skin 2 (two) times a week., Disp: , Rfl:    levothyroxine (SYNTHROID, LEVOTHROID) 137 MCG tablet, Take 137 mcg by mouth daily., Disp: , Rfl:    Melatonin 5 MG CAPS, Take by mouth. 1 qhs, Disp: , Rfl:    methylPREDNISolone (MEDROL DOSEPAK) 4 MG TBPK tablet, Take 6 tablets on day 1.  Take 5 tablets on day 2.  Take 4 tablets on day 3.  Take 3 tablets on day 4.  Take 2 tablets on day 5.  Take 1 tablet on day 6., Disp: 21 tablet, Rfl: 0    ondansetron (ZOFRAN-ODT) 4 MG disintegrating tablet, Take 1 tablet (4 mg total) by mouth every 8 (eight) hours as needed for nausea or vomiting., Disp: 20 tablet, Rfl: 0   oxyCODONE (ROXICODONE) 5 MG immediate release tablet, Take 1 tablet (5 mg total) by mouth every 6 (six) hours as needed for up to 6 doses for severe pain., Disp: 30 tablet, Rfl: 0   pantoprazole (PROTONIX) 40 MG tablet, Take 40 mg by mouth daily., Disp: , Rfl:    Polyethyl Glycol-Propyl Glycol (SYSTANE) 0.4-0.3 % SOLN, Apply 1 drop to eye daily as needed (for dry eye)., Disp: , Rfl:    progesterone (PROMETRIUM) 100 MG capsule, Take 100 mg by mouth daily. At bedtime, Disp: , Rfl:    rizatriptan (MAXALT) 10 MG tablet, Take 10 mg by mouth every 2 (two) hours as needed for migraine., Disp: , Rfl: 3   Vitamin D, Ergocalciferol, (DRISDOL) 1.25 MG (50000 UNIT) CAPS capsule, Take 1 capsule (50,000 Units total) by mouth every 7 (seven) days., Disp: 4 capsule, Rfl: 0   Objective:     Vitals:   12/26/21 0953  BP: 110/80  Pulse: 79  SpO2: 99%  Weight: 208 lb (94.3 kg)  Height: '5\' 9"'$  (1.753 m)      Body mass index is 30.72 kg/m.    Physical Exam:    General: Well-appearing, cooperative, sitting comfortably in no acute distress.   OMT Physical Exam:    Cervical: TTP paraspinal, C4-7 RL SL Rib: Bilateral elevated first rib with TTP, worse on left Thoracic: TTP paraspinal, T3-5 RRSL     Electronically signed by:  Yolanda Jordan D.[facility] 10:29 AM 12/26/21

## 2021-12-26 ENCOUNTER — Ambulatory Visit: Payer: Medicare HMO | Admitting: Sports Medicine

## 2021-12-26 VITALS — BP 110/80 | HR 79 | Ht 69.0 in | Wt 208.0 lb

## 2021-12-26 DIAGNOSIS — S46812A Strain of other muscles, fascia and tendons at shoulder and upper arm level, left arm, initial encounter: Secondary | ICD-10-CM

## 2021-12-26 DIAGNOSIS — M542 Cervicalgia: Secondary | ICD-10-CM | POA: Diagnosis not present

## 2021-12-26 MED ORDER — MELOXICAM 15 MG PO TABS: 15.0000 mg | ORAL_TABLET | Freq: Every day | ORAL | 0 refills | Status: DC

## 2021-12-26 NOTE — Patient Instructions (Addendum)
Good to see you  - Start meloxicam 15 mg daily x2 weeks.  If still having pain after 2 weeks, complete 3rd-week of meloxicam. May use remaining meloxicam as needed once daily for pain control.  Do not to use additional NSAIDs while taking meloxicam.  May use Tylenol [phone] mg 2 to 3 times a day for breakthrough pain. Continue HEP  3 week follow up

## 2022-01-15 NOTE — Progress Notes (Signed)
Benito Mccreedy D.[place] Phone: [phone]   Assessment and Plan:     1. Neck pain 2. Strain of left trapezius muscle, initial encounter  -Subacute, no improvement, subsequent visit - Essentially unchanged symptoms with treatment that is included HEP, prednisone Dosepak, 3-week course of meloxicam 15 mg daily, OMT - X-ray obtained in clinic.  My interpretation: No acute fracture or vertebral collapse.  Loss of typical cervical lordosis.  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  Based on failure to improve with >6 weeks of conservative therapy, continue day-to-day pain, pain restricting patient's day-to-day activities, pain frequently >6/10, I feel that we need to further evaluate with C-spine MRI - Discontinue daily meloxicam and may use remainder as needed  Pertinent previous records reviewed include none   Follow Up: 3 days after MRI to review results.  Could consider epidural versus facet injections if appropriate based on imaging   Subjective:   I, Moenique Parris, am serving as a Education administrator for Doctor Glennon Mac   Chief Complaint: neck pain    HPI:    12/08/21 Patient is a 56 year old female complaining of neck pain. Patient states that she was painting a ceiling and now she cant put it back she has decreased ROM , no radiating pain, took ib for the pain and it helped a little not enough though , tylenol doesn't touch it , no numbness or tingling , has used ice and heat and voltaren gel and doesn't help    12/26/2021 Patient states that she is the same , is using a TENS unit   03/18/2021 Patient states that she is the same , she had a little relief after last adjustment but the pain came right back    Relevant Historical  Information: Hypertension Additional pertinent review of systems negative.   Current Outpatient Medications:    ALPRAZolam (XANAX) 1 MG tablet, Take 1 mg by mouth See admin instructions. Up to 6 x daily prn for anxiety, Disp: , Rfl:    amLODipine (NORVASC) 2.5 MG tablet, Take 2.5 mg by mouth daily., Disp: , Rfl:    brexpiprazole (REXULTI) 1 MG TABS tablet, Take 1 mg by mouth daily., Disp: , Rfl:    buPROPion (WELLBUTRIN XL) 150 MG 24 hr tablet, Take 150 mg by mouth daily. Three tablets once per day, Disp: , Rfl:    cyclobenzaprine (FLEXERIL) 5 MG tablet, Take 1 tablet (5 mg total) by mouth 3 (three) times daily as needed for muscle spasms., Disp: 30 tablet, Rfl: 0   estradiol (VIVELLE-DOT) 0.075 MG/24HR, Place 1 patch onto the skin 2 (two) times a week., Disp: , Rfl:    levothyroxine (SYNTHROID, LEVOTHROID) 137 MCG tablet, Take 137 mcg by mouth daily., Disp: , Rfl:    Melatonin 5 MG CAPS, Take by mouth. 1 qhs, Disp: , Rfl:    meloxicam (MOBIC) 15 MG tablet, Take 1 tablet (15 mg total) by mouth daily., Disp: 30 tablet, Rfl: 0   methylPREDNISolone (MEDROL DOSEPAK) 4 MG TBPK tablet, Take 6 tablets on day 1.  Take 5 tablets on day 2.  Take 4 tablets on day 3.  Take 3 tablets on day 4.  Take 2 tablets on day 5.  Take 1 tablet on day 6., Disp: 21 tablet, Rfl: 0  ondansetron (ZOFRAN-ODT) 4 MG disintegrating tablet, Take 1 tablet (4 mg total) by mouth every 8 (eight) hours as needed for nausea or vomiting., Disp: 20 tablet, Rfl: 0   oxyCODONE (ROXICODONE) 5 MG immediate release tablet, Take 1 tablet (5 mg total) by mouth every 6 (six) hours as needed for up to 6 doses for severe pain., Disp: 30 tablet, Rfl: 0   pantoprazole (PROTONIX) 40 MG tablet, Take 40 mg by mouth daily., Disp: , Rfl:    Polyethyl Glycol-Propyl Glycol (SYSTANE) 0.4-0.3 % SOLN, Apply 1 drop to eye daily as needed (for dry eye)., Disp: , Rfl:    progesterone (PROMETRIUM) 100 MG capsule, Take 100 mg by mouth daily. At bedtime, Disp: ,  Rfl:    rizatriptan (MAXALT) 10 MG tablet, Take 10 mg by mouth every 2 (two) hours as needed for migraine., Disp: , Rfl: 3   Vitamin D, Ergocalciferol, (DRISDOL) 1.25 MG (50000 UNIT) CAPS capsule, Take 1 capsule (50,000 Units total) by mouth every 7 (seven) days., Disp: 4 capsule, Rfl: 0   Objective:     Vitals:   01/16/22 1026  BP: 118/84  Pulse: 68  SpO2: 98%  Weight: 208 lb (94.3 kg)  Height: '5\' 9"'$  (1.753 m)      Body mass index is 30.72 kg/m.    Physical Exam:    Cervical Spine: Posture normal Skin: normal, intact  Neurological:   Strength:  Right  Left   Deltoid (C5) 5/5 5/5  Bicep/Brachioradialis (C5/6) 5/5  5/5  Wrist Extension (C6) 5/5 5/5  Tricep (C7) 5/5 5/5  Wrist Flexion (C7) 5/5 5/5  Grip (C8) 5/5 5/5  Finger Abduction (T1) 5/5 5/5   Sensation: intact to light touch in upper extremities bilaterally  Spurling's:  negative bilaterally Neck ROM: Reduced flexion and rotation TTP: Bilateral cervical paraspinal, trapezius NTTP: cervical spinous processes    Electronically signed by:  Benito Mccreedy D.[facility] 10:34 AM 01/16/22

## 2022-01-16 ENCOUNTER — Ambulatory Visit (INDEPENDENT_AMBULATORY_CARE_PROVIDER_SITE_OTHER): Payer: Medicare HMO

## 2022-01-16 ENCOUNTER — Ambulatory Visit: Payer: Medicare HMO | Admitting: Sports Medicine

## 2022-01-16 VITALS — BP 118/84 | HR 68 | Ht 69.0 in | Wt 208.0 lb

## 2022-01-16 DIAGNOSIS — M542 Cervicalgia: Secondary | ICD-10-CM | POA: Diagnosis not present

## 2022-01-16 DIAGNOSIS — S46812A Strain of other muscles, fascia and tendons at shoulder and upper arm level, left arm, initial encounter: Secondary | ICD-10-CM

## 2022-01-16 NOTE — Patient Instructions (Addendum)
Good to see you  Cspine MRI Follow up 3 days after to discuss your results

## 2022-01-24 ENCOUNTER — Other Ambulatory Visit: Payer: Medicare HMO

## 2022-01-26 ENCOUNTER — Ambulatory Visit (INDEPENDENT_AMBULATORY_CARE_PROVIDER_SITE_OTHER): Payer: Medicare HMO

## 2022-01-26 DIAGNOSIS — S46812A Strain of other muscles, fascia and tendons at shoulder and upper arm level, left arm, initial encounter: Secondary | ICD-10-CM

## 2022-01-26 DIAGNOSIS — M542 Cervicalgia: Secondary | ICD-10-CM

## 2022-01-28 NOTE — Progress Notes (Unsigned)
Yolanda Jordan D.[place] Phone: [phone]   Assessment and Plan:     There are no diagnoses linked to this encounter.  ***   Pertinent previous records reviewed include ***   Follow Up: ***     Subjective:   I, Yolanda Jordan, am serving as a Education administrator for Doctor Yolanda Jordan   Chief Complaint: neck pain    HPI:    12/08/21 Patient is a 56 year old female complaining of neck pain. Patient states that she was painting a ceiling and now she cant put it back she has decreased ROM , no radiating pain, took ib for the pain and it helped a little not enough though , tylenol doesn't touch it , no numbness or tingling , has used ice and heat and voltaren gel and doesn't help    12/26/2021 Patient states that she is the same , is using a TENS unit   03/18/2021 Patient states that she is the same , she had a little relief after last adjustment but the pain came right back    01/29/2022 Patient states    Relevant Historical Information: Hypertension Additional pertinent review of systems negative.   Current Outpatient Medications:    ALPRAZolam (XANAX) 1 MG tablet, Take 1 mg by mouth See admin instructions. Up to 6 x daily prn for anxiety, Disp: , Rfl:    amLODipine (NORVASC) 2.5 MG tablet, Take 2.5 mg by mouth daily., Disp: , Rfl:    brexpiprazole (REXULTI) 1 MG TABS tablet, Take 1 mg by mouth daily., Disp: , Rfl:    buPROPion (WELLBUTRIN XL) 150 MG 24 hr tablet, Take 150 mg by mouth daily. Three tablets once per day, Disp: , Rfl:    cyclobenzaprine (FLEXERIL) 5 MG tablet, Take 1 tablet (5 mg total) by mouth 3 (three) times daily as needed for muscle spasms., Disp: 30 tablet, Rfl: 0   estradiol (VIVELLE-DOT) 0.075 MG/24HR, Place 1 patch onto the skin 2 (two) times a week., Disp: , Rfl:    levothyroxine (SYNTHROID, LEVOTHROID) 137 MCG tablet, Take 137 mcg by mouth daily., Disp: , Rfl:    Melatonin 5  MG CAPS, Take by mouth. 1 qhs, Disp: , Rfl:    meloxicam (MOBIC) 15 MG tablet, Take 1 tablet (15 mg total) by mouth daily., Disp: 30 tablet, Rfl: 0   methylPREDNISolone (MEDROL DOSEPAK) 4 MG TBPK tablet, Take 6 tablets on day 1.  Take 5 tablets on day 2.  Take 4 tablets on day 3.  Take 3 tablets on day 4.  Take 2 tablets on day 5.  Take 1 tablet on day 6., Disp: 21 tablet, Rfl: 0   ondansetron (ZOFRAN-ODT) 4 MG disintegrating tablet, Take 1 tablet (4 mg total) by mouth every 8 (eight) hours as needed for nausea or vomiting., Disp: 20 tablet, Rfl: 0   oxyCODONE (ROXICODONE) 5 MG immediate release tablet, Take 1 tablet (5 mg total) by mouth every 6 (six) hours as needed for up to 6 doses for severe pain., Disp: 30 tablet, Rfl: 0   pantoprazole (PROTONIX) 40 MG tablet, Take 40 mg by mouth daily., Disp: , Rfl:    Polyethyl Glycol-Propyl Glycol (SYSTANE) 0.4-0.3 % SOLN, Apply 1 drop to eye daily as needed (for dry eye)., Disp: , Rfl:    progesterone (PROMETRIUM) 100 MG capsule, Take 100 mg by mouth daily. At bedtime, Disp: , Rfl:    rizatriptan (MAXALT) 10 MG tablet,  Take 10 mg by mouth every 2 (two) hours as needed for migraine., Disp: , Rfl: 3   Vitamin D, Ergocalciferol, (DRISDOL) 1.25 MG (50000 UNIT) CAPS capsule, Take 1 capsule (50,000 Units total) by mouth every 7 (seven) days., Disp: 4 capsule, Rfl: 0   Objective:     There were no vitals filed for this visit.    There is no height or weight on file to calculate BMI.    Physical Exam:    ***   Electronically signed by:  Yolanda Jordan D.[facility] 8:20 AM 01/28/22

## 2022-01-29 ENCOUNTER — Ambulatory Visit: Payer: Medicare HMO | Admitting: Sports Medicine

## 2022-01-29 VITALS — HR 96 | Ht 69.0 in | Wt 208.0 lb

## 2022-01-29 DIAGNOSIS — M5020 Other cervical disc displacement, unspecified cervical region: Secondary | ICD-10-CM | POA: Diagnosis not present

## 2022-01-29 DIAGNOSIS — M50323 Other cervical disc degeneration at C6-C7 level: Secondary | ICD-10-CM | POA: Diagnosis not present

## 2022-01-29 DIAGNOSIS — M542 Cervicalgia: Secondary | ICD-10-CM | POA: Diagnosis not present

## 2022-01-29 DIAGNOSIS — M50321 Other cervical disc degeneration at C4-C5 level: Secondary | ICD-10-CM | POA: Diagnosis not present

## 2022-01-29 DIAGNOSIS — M5030 Other cervical disc degeneration, unspecified cervical region: Secondary | ICD-10-CM

## 2022-01-29 NOTE — Patient Instructions (Addendum)
Good to see you Epidural referral left C5-C6 Follow up 2 weeks after to discuss results

## 2022-02-06 ENCOUNTER — Ambulatory Visit
Admission: RE | Admit: 2022-02-06 | Discharge: 2022-02-06 | Disposition: A | Discharge status: Home / Self Care | Payer: Medicare HMO | Source: Ambulatory Visit | Attending: Sports Medicine | Admitting: Sports Medicine

## 2022-02-06 DIAGNOSIS — M542 Cervicalgia: Secondary | ICD-10-CM

## 2022-02-06 DIAGNOSIS — M5020 Other cervical disc displacement, unspecified cervical region: Secondary | ICD-10-CM

## 2022-02-06 DIAGNOSIS — M5030 Other cervical disc degeneration, unspecified cervical region: Secondary | ICD-10-CM

## 2022-02-06 MED ORDER — IOPAMIDOL (ISOVUE-M 300) INJECTION 61%
1.0000 mL | Freq: Once | INTRAMUSCULAR | Status: AC | PRN
  Administered 2022-02-06: 1 mL via EPIDURAL

## 2022-02-06 MED ORDER — TRIAMCINOLONE ACETONIDE 40 MG/ML IJ SUSP (RADIOLOGY)
60.0000 mg | Freq: Once | INTRAMUSCULAR | Status: AC
  Administered 2022-02-06: 60 mg via EPIDURAL

## 2022-02-06 NOTE — Discharge Instructions (Signed)

## 2022-02-13 ENCOUNTER — Other Ambulatory Visit: Payer: Medicare HMO

## 2022-02-19 NOTE — Progress Notes (Signed)
Yolanda Jordan D.[place] Phone: [phone]   Assessment and Plan:     1. Neck pain 2. Cervical herniated disc 3. DDD (degenerative disc disease), cervical 4. Strain of left trapezius muscle, initial encounter -Chronic with exacerbation, subsequent visit - Reassuring that patient had "80% relief" for the first several days after epidural CSI to left-sided C-spine C7-T1 on 02/06/2022, however improvement has regressed to only about "50% relief" at this time - Based on benefit from CSI, we will repeat epidural CSI to C7-T1 on left for the hope of additive and more long-lasting benefit - May continue Tylenol, heating pads, HEP   Pertinent previous records reviewed include epidural CSI procedure note 02/06/2022   Follow Up: 2 weeks after repeat epidural to review benefit   Subjective:   I, Pincus Badder, am serving as a Education administrator for Doctor [employer]   Chief Complaint: neck pain    HPI:    12/08/21 Patient is a 56 year old female complaining of neck pain. Patient states that she was painting a ceiling and now she cant put it back she has decreased ROM , no radiating pain, took ib for the pain and it helped a little not enough though , tylenol doesn't touch it , no numbness or tingling , has used ice and heat and voltaren gel and doesn't help    12/26/2021 Patient states that she is the same , is using a TENS unit   03/18/2021 Patient states that she is the same , she had a little relief after last adjustment but the pain came right back    01/29/2022 Patient states same ole same    02/20/2022 Patient states that she only had 50 % improvement     Relevant Historical Information: Hypertension  Additional pertinent review of systems negative.   Current Outpatient Medications:    amLODipine (NORVASC) 2.5 MG tablet, Take 2.5 mg by mouth daily., Disp: , Rfl:    cyclobenzaprine (FLEXERIL) 5 MG  tablet, Take 1 tablet (5 mg total) by mouth 3 (three) times daily as needed for muscle spasms., Disp: 30 tablet, Rfl: 0   cycloSPORINE (RESTASIS) 0.05 % ophthalmic emulsion, 1 drop 2 (two) times daily., Disp: , Rfl:    lactulose (CEPHULAC) 10 g packet, Take 10 g by mouth 2 (two) times daily., Disp: , Rfl:    ondansetron (ZOFRAN-ODT) 4 MG disintegrating tablet, Take 1 tablet (4 mg total) by mouth every 8 (eight) hours as needed for nausea or vomiting., Disp: 20 tablet, Rfl: 0   oxyCODONE (ROXICODONE) 5 MG immediate release tablet, Take 1 tablet (5 mg total) by mouth every 6 (six) hours as needed for up to 6 doses for severe pain., Disp: 30 tablet, Rfl: 0   rizatriptan (MAXALT) 10 MG tablet, Take 10 mg by mouth every 2 (two) hours as needed for migraine., Disp: , Rfl: 3   ALPRAZolam (XANAX) 1 MG tablet, Take 1 mg by mouth See admin instructions. Up to 6 x daily prn for anxiety, Disp: , Rfl:    brexpiprazole (REXULTI) 1 MG TABS tablet, Take 1 mg by mouth daily., Disp: , Rfl:    buPROPion (WELLBUTRIN XL) 150 MG 24 hr tablet, Take 150 mg by mouth daily. Three tablets once per day, Disp: , Rfl:    estradiol (VIVELLE-DOT) 0.075 MG/24HR, Place 1 patch onto the skin 2 (two) times a week., Disp: , Rfl:    levothyroxine (SYNTHROID, LEVOTHROID) 137 MCG  tablet, Take 137 mcg by mouth daily., Disp: , Rfl:    Melatonin 5 MG CAPS, Take by mouth. 1 qhs, Disp: , Rfl:    meloxicam (MOBIC) 15 MG tablet, Take 1 tablet (15 mg total) by mouth daily., Disp: 30 tablet, Rfl: 0   methylPREDNISolone (MEDROL DOSEPAK) 4 MG TBPK tablet, Take 6 tablets on day 1.  Take 5 tablets on day 2.  Take 4 tablets on day 3.  Take 3 tablets on day 4.  Take 2 tablets on day 5.  Take 1 tablet on day 6., Disp: 21 tablet, Rfl: 0   pantoprazole (PROTONIX) 40 MG tablet, Take 40 mg by mouth daily., Disp: , Rfl:    Polyethyl Glycol-Propyl Glycol (SYSTANE) 0.4-0.3 % SOLN, Apply 1 drop to eye daily as needed (for dry eye)., Disp: , Rfl:    progesterone  (PROMETRIUM) 100 MG capsule, Take 100 mg by mouth daily. At bedtime, Disp: , Rfl:    Vitamin D, Ergocalciferol, (DRISDOL) 1.25 MG (50000 UNIT) CAPS capsule, Take 1 capsule (50,000 Units total) by mouth every 7 (seven) days., Disp: 4 capsule, Rfl: 0   Objective:     Vitals:   02/20/22 1057  Pulse: 74  SpO2: 99%  Weight: 208 lb (94.3 kg)  Height: '5\' 9"'$  (1.753 m)      Body mass index is 30.72 kg/m.    Physical Exam:    Cervical Spine: Posture normal Skin: normal, intact   Neurological:    Strength:   Right  Left  Deltoid (C5) 5/5 5/5 Bicep/Brachioradialis (C5/6) 5/5  5/5 Wrist Extension (C6) 5/5 5/5 Tricep (C7) 5/5 5/5 Wrist Flexion (C7) 5/5 5/5 Grip (C8) 5/5 5/5 Finger Abduction (T1) 5/5 5/5   Sensation: intact to light touch in upper extremities bilaterally   Spurling's:  negative bilaterally Neck ROM: Reduced flexion and rotation TTP: Mildly to bilateral cervical paraspinal, trapezius NTTP: cervical spinous processes     Electronically signed by:  Yolanda Jordan D.[facility] 11:16 AM 02/20/22

## 2022-02-20 ENCOUNTER — Ambulatory Visit: Payer: Medicare HMO | Admitting: Sports Medicine

## 2022-02-20 VITALS — HR 74 | Ht 69.0 in | Wt 208.0 lb

## 2022-02-20 DIAGNOSIS — S46812A Strain of other muscles, fascia and tendons at shoulder and upper arm level, left arm, initial encounter: Secondary | ICD-10-CM | POA: Diagnosis not present

## 2022-02-20 DIAGNOSIS — M50323 Other cervical disc degeneration at C6-C7 level: Secondary | ICD-10-CM

## 2022-02-20 DIAGNOSIS — M50321 Other cervical disc degeneration at C4-C5 level: Secondary | ICD-10-CM

## 2022-02-20 DIAGNOSIS — M542 Cervicalgia: Secondary | ICD-10-CM | POA: Diagnosis not present

## 2022-02-20 DIAGNOSIS — M5020 Other cervical disc displacement, unspecified cervical region: Secondary | ICD-10-CM | POA: Diagnosis not present

## 2022-02-20 DIAGNOSIS — M5030 Other cervical disc degeneration, unspecified cervical region: Secondary | ICD-10-CM

## 2022-02-20 NOTE — Patient Instructions (Addendum)
Good to see you  Epidural left C7-T1 Follow up 2 weeks after to discus results

## 2022-03-12 ENCOUNTER — Ambulatory Visit
Admission: RE | Admit: 2022-03-12 | Discharge: 2022-03-12 | Disposition: A | Discharge status: Home / Self Care | Payer: Medicare HMO | Source: Ambulatory Visit | Attending: Sports Medicine | Admitting: Sports Medicine

## 2022-03-12 DIAGNOSIS — M5030 Other cervical disc degeneration, unspecified cervical region: Secondary | ICD-10-CM

## 2022-03-12 DIAGNOSIS — M5020 Other cervical disc displacement, unspecified cervical region: Secondary | ICD-10-CM

## 2022-03-12 DIAGNOSIS — M542 Cervicalgia: Secondary | ICD-10-CM

## 2022-03-12 DIAGNOSIS — S46812A Strain of other muscles, fascia and tendons at shoulder and upper arm level, left arm, initial encounter: Secondary | ICD-10-CM

## 2022-03-12 MED ORDER — IOPAMIDOL (ISOVUE-M 300) INJECTION 61%
1.0000 mL | Freq: Once | INTRAMUSCULAR | Status: AC | PRN
  Administered 2022-03-12: 1 mL via EPIDURAL

## 2022-03-12 MED ORDER — TRIAMCINOLONE ACETONIDE 40 MG/ML IJ SUSP (RADIOLOGY)
60.0000 mg | Freq: Once | INTRAMUSCULAR | Status: AC
  Administered 2022-03-12: 60 mg via EPIDURAL

## 2022-03-12 NOTE — Discharge Instructions (Signed)

## 2022-03-23 NOTE — Progress Notes (Signed)
Yolanda Jordan D.[facility] Phone: [phone]   Assessment and Plan:     1. Neck pain 2. Cervical herniated disc 3. DDD (degenerative disc disease), cervical -Chronic with exacerbation, subsequent visit - Patient had nearly "80% relief" after first epidural CSI to left-sided C7-T1 on 02/06/2022, however she has had no relief or worsening of symptoms since repeat epidural on left-sided C6-C7 performed on 03/12/2022 - Based on improvement the patient after her first epidural, I recommend repeating epidural at left-sided C7-T1. With last epidural performed on 03/12/2022, I recommend next epidural be performed at least 2 weeks after, 03/26/2022 or later - Start prednisone Dosepak.  Prescription provided today - Recommend taking Tylenol 1000 mg 3 times a day while currently in a flare  Pertinent previous records reviewed include epidural procedure note 03/12/2022, epidural procedure note 02/06/2022   Follow Up: 2 weeks after epidural.  If no improvement or worsening of symptoms, would refer to neurosurgery   Subjective:   I, Yolanda Jordan, am serving as a scribe for Dr. Glennon Mac   Chief Complaint: neck pain    HPI:    12/08/21 Patient is a 57 year old female complaining of neck pain. Patient states that she was painting a ceiling and now she cant put it back she has decreased ROM , no radiating pain, took ib for the pain and it helped a little not enough though , tylenol doesn't touch it , no numbness or tingling , has used ice and heat and voltaren gel and doesn't help    12/26/2021 Patient states that she is the same , is using a TENS unit   03/18/2021 Patient states that she is the same , she had a little relief after last adjustment but the pain came right back    01/29/2022 Patient states same ole same    02/20/2022 Patient states that she only had 50 % improvement    03/24/2022 Patient states  that she is not feeling any better she actually feels worse after her most recent epidural.    Relevant Historical Information: Hypertension  Additional pertinent review of systems negative.   Current Outpatient Medications:    ALPRAZolam (XANAX) 0.5 MG tablet, Take 0.5 mg by mouth 2 (two) times daily as needed for anxiety., Disp: , Rfl:    atorvastatin (LIPITOR) 20 MG tablet, Take 1 tablet by mouth daily., Disp: , Rfl:    buPROPion (WELLBUTRIN XL) 300 MG 24 hr tablet, Take 300 mg by mouth daily., Disp: , Rfl:    Cholecalciferol 50 MCG (2000 UT) CAPS, 2,000 Units., Disp: , Rfl:    cyclobenzaprine (FLEXERIL) 5 MG tablet, Take 1 tablet (5 mg total) by mouth 3 (three) times daily as needed for muscle spasms., Disp: 30 tablet, Rfl: 0   cycloSPORINE (RESTASIS) 0.05 % ophthalmic emulsion, 1 drop 2 (two) times daily., Disp: , Rfl:    lactulose (CEPHULAC) 10 g packet, Take 10 g by mouth 2 (two) times daily., Disp: , Rfl:    levothyroxine (SYNTHROID) 112 MCG tablet, Take 112 mcg by mouth daily before breakfast., Disp: , Rfl:    ondansetron (ZOFRAN-ODT) 4 MG disintegrating tablet, Take 1 tablet (4 mg total) by mouth every 8 (eight) hours as needed for nausea or vomiting., Disp: 20 tablet, Rfl: 0   pantoprazole (PROTONIX) 20 MG tablet, Take 20 mg by mouth daily., Disp: , Rfl:    Polyethylene Glycol 400 (BLINK TEARS) 0.25 % SOLN, 0.25  mg., Disp: , Rfl:    rizatriptan (MAXALT) 10 MG tablet, Take 10 mg by mouth every 2 (two) hours as needed for migraine., Disp: , Rfl: 3   traZODone (DESYREL) 50 MG tablet, Take 50 mg by mouth at bedtime as needed., Disp: , Rfl:    Objective:     Vitals:   03/24/22 0827  BP: 120/86  Pulse: 88  SpO2: 97%  Weight: 210 lb (95.3 kg)  Height: '5\' 9"'$  (1.753 m)      Body mass index is 31.01 kg/m.    Physical Exam:    Cervical Spine: Posture normal Skin: normal, intact   Neurological:    Strength:   Right  Left  Deltoid (C5) 5/5 5/5 Bicep/Brachioradialis (C5/6)  5/5  5/5 Wrist Extension (C6) 5/5 5/5 Tricep (C7) 5/5 5/5 Wrist Flexion (C7) 5/5 5/5 Grip (C8) 5/5 5/5 Finger Abduction (T1) 5/5 5/5   Sensation: intact to light touch in upper extremities bilaterally   Spurling's:  negative bilaterally Neck ROM: Reduced flexion, extension and rotation TTP: Mildly to bilateral cervical paraspinal, trapezius NTTP: cervical spinous processes     Electronically signed by:  Yolanda Jordan D.[facility] 8:50 AM 03/24/22

## 2022-03-24 ENCOUNTER — Ambulatory Visit: Payer: Medicare HMO | Admitting: Sports Medicine

## 2022-03-24 VITALS — BP 120/86 | HR 88 | Ht 69.0 in | Wt 210.0 lb

## 2022-03-24 DIAGNOSIS — M542 Cervicalgia: Secondary | ICD-10-CM | POA: Diagnosis not present

## 2022-03-24 DIAGNOSIS — M5020 Other cervical disc displacement, unspecified cervical region: Secondary | ICD-10-CM | POA: Diagnosis not present

## 2022-03-24 DIAGNOSIS — M5030 Other cervical disc degeneration, unspecified cervical region: Secondary | ICD-10-CM | POA: Diagnosis not present

## 2022-03-24 MED ORDER — METHYLPREDNISOLONE 4 MG PO TBPK: ORAL_TABLET | ORAL | 0 refills | Status: DC

## 2022-03-24 NOTE — Patient Instructions (Addendum)
Good to see you Repeat epidural ordered  Prednisone dose pak ordered  Follow up 2 weeks after the injection

## 2022-03-26 ENCOUNTER — Ambulatory Visit: Payer: Medicare HMO | Admitting: Sports Medicine

## 2022-04-09 ENCOUNTER — Ambulatory Visit
Admission: RE | Admit: 2022-04-09 | Discharge: 2022-04-09 | Disposition: A | Discharge status: Home / Self Care | Payer: Medicare HMO | Source: Ambulatory Visit | Attending: Sports Medicine | Admitting: Sports Medicine

## 2022-04-09 DIAGNOSIS — M5020 Other cervical disc displacement, unspecified cervical region: Secondary | ICD-10-CM

## 2022-04-09 DIAGNOSIS — M542 Cervicalgia: Secondary | ICD-10-CM

## 2022-04-09 DIAGNOSIS — M5030 Other cervical disc degeneration, unspecified cervical region: Secondary | ICD-10-CM

## 2022-04-09 MED ORDER — IOPAMIDOL (ISOVUE-M 300) INJECTION 61%
1.0000 mL | Freq: Once | INTRAMUSCULAR | Status: AC | PRN
  Administered 2022-04-09: 1 mL via EPIDURAL

## 2022-04-09 MED ORDER — TRIAMCINOLONE ACETONIDE 40 MG/ML IJ SUSP (RADIOLOGY)
60.0000 mg | Freq: Once | INTRAMUSCULAR | Status: AC
  Administered 2022-04-09: 60 mg via EPIDURAL

## 2022-04-09 NOTE — Discharge Instructions (Signed)

## 2022-04-14 ENCOUNTER — Ambulatory Visit
Admission: EM | Admit: 2022-04-14 | Discharge: 2022-04-14 | Disposition: A | Discharge status: Home / Self Care | Payer: Medicare HMO | Attending: Family Medicine | Admitting: Family Medicine

## 2022-04-14 ENCOUNTER — Encounter: Payer: Self-pay | Admitting: Emergency Medicine

## 2022-04-14 DIAGNOSIS — R310 Gross hematuria: Secondary | ICD-10-CM | POA: Insufficient documentation

## 2022-04-14 DIAGNOSIS — R300 Dysuria: Secondary | ICD-10-CM | POA: Insufficient documentation

## 2022-04-14 LAB — POCT URINALYSIS DIP (MANUAL ENTRY)
Bilirubin, UA: NEGATIVE
Glucose, UA: NEGATIVE mg/dL
Ketones, POC UA: NEGATIVE mg/dL
Nitrite, UA: NEGATIVE
Protein Ur, POC: NEGATIVE mg/dL
Spec Grav, UA: 1.02 (ref 1.010–1.025)
Urobilinogen, UA: 0.2 E.U./dL
pH, UA: 7 (ref 5.0–8.0)

## 2022-04-14 MED ORDER — SULFAMETHOXAZOLE-TRIMETHOPRIM 800-160 MG PO TABS: 1.0000 | ORAL_TABLET | Freq: Two times a day (BID) | ORAL | 0 refills | Status: AC

## 2022-04-14 MED ORDER — FLUCONAZOLE 150 MG PO TABS: 150.0000 mg | ORAL_TABLET | Freq: Every day | ORAL | 0 refills | Status: DC

## 2022-04-14 NOTE — ED Triage Notes (Signed)
Dysuria x 1 week  Treated self at home w/ a "stock pile of amoxicillin at home 500 mg TID " Took AZO  earlier in the week  - none x 2 days Always occurs if pt does not void post coitus

## 2022-04-14 NOTE — ED Provider Notes (Signed)
[facility]    CSN: [id number] Arrival date & time: 04/14/22  1608      History   Chief Complaint Chief Complaint  Patient presents with   Dysuria    HPI Yolanda Jordan is a 57 y.o. female.   HPI  She states that she has had recurring bladder infections "all my life" She states that she had sexual relations a week ago with her husband and the next morning woke up with another bladder infection.  She states she was tired and did not empty her bladder properly.  This has happened to her before.  She states that she had amoxicillin at home from a prior problem so she has been taking this for few days.  This morning she still had some dysuria, noticed that her urine was pink, and realized the amoxicillin is not working.  Is here for evaluation  Past Medical History:  Diagnosis Date   Anxiety    Back pain    Bipolar 1 disorder ([place])    Cancer ([place]) 2000   thyroid cancer   Chronic kidney disease, stage 3 (HCC)    Constipation    Depression    Drug use    Dyspnea    panic attacks, high humidity   GERD (gastroesophageal reflux disease)    GERD (gastroesophageal reflux disease)    Headache    Migraines   High blood pressure    Hx of thyroid cancer    Hypothyroidism    Joint pain    Memory loss    PONV (postoperative nausea and vomiting)    Sleep apnea    SOB (shortness of breath)    Thyroid disease    Vitamin D deficiency     Patient Active Problem List   Diagnosis Date Noted   Insulin resistance 07/18/2019   Class 2 severe obesity with serious comorbidity and body mass index (BMI) of 37.0 to 37.9 in adult ([place]) 06/22/2019   Stage 3a chronic kidney disease ([place]) 05/09/2019   Essential hypertension 01/12/2018   Myopia with astigmatism and presbyopia, bilateral 07/03/2016   Family history of colonic polyps 06/30/2016   Gastroesophageal reflux disease without esophagitis 06/30/2016   Juvenile polyposis syndrome 06/30/2016   Prediabetes 09/24/2015    OSA (obstructive sleep apnea) 08/06/2015   Vitamin D deficiency 03/13/2015   H/O radioactive iodine thyroid ablation 12/03/2013   Migraine with aura and without status migrainosus, not intractable 05/10/2013   Bipolar 1 disorder, depressed ([place]) 11/04/2012   Hypothyroidism 11/04/2012   History of thyroid cancer 07/28/2011    Past Surgical History:  Procedure Laterality Date   APPENDECTOMY     BLADDER SUSPENSION N/A 01/03/2016   Procedure: TRANSVAGINAL TAPE (TVT) PROCEDURE;  Surgeon: Cheri Fowler, MD;  Location: [facility];  Service: Gynecology;  Laterality: N/A;   CESAREAN SECTION     CYSTOSCOPY N/A 01/03/2016   Procedure: CYSTOSCOPY;  Surgeon: Cheri Fowler, MD;  Location: [facility];  Service: Gynecology;  Laterality: N/A;   HEMORROIDECTOMY     age 33   PERINEOPLASTY N/A 01/03/2016   Procedure: PERINEOPLASTY;  Surgeon: Cheri Fowler, MD;  Location: [facility];  Service: Gynecology;  Laterality: N/A;   THYROIDECTOMY     TUBAL LIGATION     uterine ablation     05/2010   WISDOM TOOTH EXTRACTION      OB History     Gravida  5   Para  5   Term      Preterm  AB      Living         SAB      IAB      Ectopic      Multiple      Live Births               Home Medications    Prior to Admission medications   Medication Sig Start Date End Date Taking? Authorizing Provider  fluconazole (DIFLUCAN) 150 MG tablet Take 1 tablet (150 mg total) by mouth daily. Repeat in 1 week if needed 04/14/22  Yes Raylene Everts, MD  sulfamethoxazole-trimethoprim (BACTRIM DS) 800-160 MG tablet Take 1 tablet by mouth 2 (two) times daily for 7 days. 04/14/22 04/21/22 Yes Raylene Everts, MD  ALPRAZolam Duanne Moron) 0.5 MG tablet Take 0.5 mg by mouth 2 (two) times daily as needed for anxiety.    [provider]  atorvastatin (LIPITOR) 20 MG tablet Take 1 tablet by mouth daily. 02/24/22   [provider]  buPROPion (WELLBUTRIN XL) 300 MG 24 hr tablet Take 300 mg by mouth  daily. 02/24/22 08/04/22  [provider]  Cholecalciferol 50 MCG (2000 UT) CAPS 2,000 Units.    [provider]  cyclobenzaprine (FLEXERIL) 5 MG tablet Take 1 tablet (5 mg total) by mouth 3 (three) times daily as needed for muscle spasms. 12/08/21   Glennon Mac, DO  cycloSPORINE (RESTASIS) 0.05 % ophthalmic emulsion 1 drop 2 (two) times daily.    [provider]  lactulose (CEPHULAC) 10 g packet Take 10 g by mouth 2 (two) times daily.    [provider]  levothyroxine (SYNTHROID) 112 MCG tablet Take 112 mcg by mouth daily before breakfast.    [provider]  Polyethylene Glycol 400 (BLINK TEARS) 0.25 % SOLN 0.25 mg.    [provider]  rizatriptan (MAXALT) 10 MG tablet Take 10 mg by mouth every 2 (two) hours as needed for migraine. 10/16/15   [provider]  traZODone (DESYREL) 50 MG tablet Take 50 mg by mouth at bedtime as needed. 09/22/21   [provider]    Family History Family History  Problem Relation Age of Onset   Healthy Mother    Stroke Mother    Depression Mother    Hypertension Father    Diabetes Father    Renal Disease Father    High Cholesterol Father    Heart disease Father    Kidney disease Father    Anxiety disorder Father    Bipolar disorder Father    Sleep apnea Father    Obesity Father     Social History Social History   Tobacco Use   Smoking status: Never   Smokeless tobacco: Never  Vaping Use   Vaping Use: Former  Substance Use Topics   Alcohol use: Not Currently   Drug use: Not Currently    Types: Marijuana     Allergies   Buspirone, Ciprofloxacin, and Morphine and related   Review of Systems Review of Systems See HPI  Physical Exam Triage Vital Signs ED Triage Vitals  Enc Vitals Group     BP 04/14/22 1631 123/85     Pulse Rate 04/14/22 1631 70     Resp 04/14/22 1631 14     Temp 04/14/22 1631 99.1 F (37.3 C)     Temp Source 04/14/22 1631 Oral     SpO2  04/14/22 1631 96 %     Weight 04/14/22 1634 205 lb (93 kg)  Height 04/14/22 1634 5' 9"$  (1.753 m)     Head Circumference --      Peak Flow --      Pain Score 04/14/22 1634 4     Pain Loc --      Pain Edu? --      Excl. in [place]? --    No data found.  Updated Vital Signs BP 123/85 (BP Location: Right Arm)   Pulse 70   Temp 99.1 F (37.3 C) (Oral)   Resp 14   Ht 5' 9"$  (1.753 m)   Wt 93 kg   SpO2 96%   BMI 30.27 kg/m      Physical Exam Constitutional:      General: She is not in acute distress.    Appearance: She is well-developed. She is not ill-appearing.  HENT:     Head: Normocephalic and atraumatic.  Eyes:     Conjunctiva/sclera: Conjunctivae normal.     Pupils: Pupils are equal, round, and reactive to light.  Cardiovascular:     Rate and Rhythm: Normal rate.  Pulmonary:     Effort: Pulmonary effort is normal. No respiratory distress.  Abdominal:     General: There is no distension.     Palpations: Abdomen is soft.     Tenderness: There is no abdominal tenderness. There is no right CVA tenderness or left CVA tenderness.  Musculoskeletal:        General: Normal range of motion.     Cervical back: Normal range of motion.  Skin:    General: Skin is warm and dry.  Neurological:     Mental Status: She is alert.      UC Treatments / Results  Labs (all labs ordered are listed, but only abnormal results are displayed) Labs Reviewed  POCT URINALYSIS DIP (MANUAL ENTRY) - Abnormal; Notable for the following components:      Result Value   Blood, UA trace-intact (*)    Leukocytes, UA Trace (*)    All other components within normal limits  URINE CULTURE  POCT URINALYSIS DIP (MANUAL ENTRY)    EKG   Radiology No results found.  Procedures Procedures (including critical care time)  Medications Ordered in UC Medications - No data to display  Initial Impression / Assessment and Plan / UC Course  I have reviewed the triage vital signs and the nursing  notes.  Pertinent labs & imaging results that were available during my care of the patient were reviewed by me and considered in my medical decision making (see chart for details).     I explained to the patient that her urine but she is taking amoxicillin.  She had a dose today.  The amoxicillin concentrated in the urine may affect accurate evaluation.  She should complete 7 days of antibiotic if culture grows or not Final Clinical Impressions(s) / UC Diagnoses   Final diagnoses:  Dysuria  Gross hematuria     Discharge Instructions      Stop the amoxicillin Take the sulfamethoxazole-trimethoprim every 12 hours.  Take with food Continue to drink lots of water Check urine culture in 2 to 3 days  Take Diflucan if needed    ED Prescriptions     Medication Sig Dispense Auth. Provider   sulfamethoxazole-trimethoprim (BACTRIM DS) 800-160 MG tablet Take 1 tablet by mouth 2 (two) times daily for 7 days. 14 tablet Raylene Everts, MD   fluconazole (DIFLUCAN) 150 MG tablet Take 1 tablet (150 mg total) by mouth daily. Repeat  in 1 week if needed 2 tablet Raylene Everts, MD      PDMP not reviewed this encounter.   Raylene Everts, MD 04/14/22 [phone]

## 2022-04-14 NOTE — Discharge Instructions (Signed)
Stop the amoxicillin Take the sulfamethoxazole-trimethoprim every 12 hours.  Take with food Continue to drink lots of water Check urine culture in 2 to 3 days  Take Diflucan if needed

## 2022-04-16 LAB — URINE CULTURE: Culture: <10000 — AB

## 2022-04-17 ENCOUNTER — Other Ambulatory Visit: Payer: Self-pay | Admitting: Sports Medicine

## 2022-04-17 ENCOUNTER — Telehealth: Payer: Self-pay | Admitting: Sports Medicine

## 2022-04-17 DIAGNOSIS — S46812A Strain of other muscles, fascia and tendons at shoulder and upper arm level, left arm, initial encounter: Secondary | ICD-10-CM

## 2022-04-17 DIAGNOSIS — M5020 Other cervical disc displacement, unspecified cervical region: Secondary | ICD-10-CM

## 2022-04-17 DIAGNOSIS — M542 Cervicalgia: Secondary | ICD-10-CM

## 2022-04-17 DIAGNOSIS — M5030 Other cervical disc degeneration, unspecified cervical region: Secondary | ICD-10-CM

## 2022-04-17 NOTE — Progress Notes (Unsigned)
Referral to [facility] was placed

## 2022-04-17 NOTE — Telephone Encounter (Signed)
Referral to [facility] was placed

## 2022-04-17 NOTE — Telephone Encounter (Signed)
Patient called stating that she had her epidural injection but she is still in a lot of pain.  She asked if we could go ahead and refer her to a neurosurgeon so she can get that process started?  Please advise.

## 2022-04-23 ENCOUNTER — Ambulatory Visit: Payer: Medicare HMO | Admitting: Sports Medicine

## 2022-05-19 ENCOUNTER — Ambulatory Visit (INDEPENDENT_AMBULATORY_CARE_PROVIDER_SITE_OTHER): Payer: Medicare HMO | Admitting: Psychiatry

## 2022-05-19 ENCOUNTER — Encounter (HOSPITAL_COMMUNITY): Payer: Self-pay | Admitting: Psychiatry

## 2022-05-19 DIAGNOSIS — F4310 Post-traumatic stress disorder, unspecified: Secondary | ICD-10-CM | POA: Diagnosis not present

## 2022-05-19 DIAGNOSIS — F319 Bipolar disorder, unspecified: Secondary | ICD-10-CM | POA: Diagnosis not present

## 2022-05-19 DIAGNOSIS — F411 Generalized anxiety disorder: Secondary | ICD-10-CM | POA: Diagnosis not present

## 2022-05-19 MED ORDER — GABAPENTIN 100 MG PO CAPS: 100.0000 mg | ORAL_CAPSULE | Freq: Two times a day (BID) | ORAL | 0 refills | Status: DC
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
## 2022-06-11 ENCOUNTER — Ambulatory Visit (HOSPITAL_BASED_OUTPATIENT_CLINIC_OR_DEPARTMENT_OTHER): Admit: 2022-06-11 | Payer: Medicare HMO | Admitting: Obstetrics and Gynecology

## 2022-06-11 ENCOUNTER — Other Ambulatory Visit (HOSPITAL_BASED_OUTPATIENT_CLINIC_OR_DEPARTMENT_OTHER): Payer: Self-pay | Admitting: Neurosurgery

## 2022-06-11 ENCOUNTER — Encounter (HOSPITAL_BASED_OUTPATIENT_CLINIC_OR_DEPARTMENT_OTHER): Payer: Self-pay

## 2022-06-11 DIAGNOSIS — M47812 Spondylosis without myelopathy or radiculopathy, cervical region: Secondary | ICD-10-CM

## 2022-06-11 DIAGNOSIS — N816 Rectocele: Secondary | ICD-10-CM

## 2022-06-11 SURGERY — ANTERIOR (CYSTOCELE) AND POSTERIOR REPAIR (RECTOCELE)
Anesthesia: Choice

## 2022-06-14 ENCOUNTER — Other Ambulatory Visit (HOSPITAL_COMMUNITY): Payer: Self-pay | Admitting: Psychiatry

## 2022-06-14 ENCOUNTER — Ambulatory Visit (HOSPITAL_BASED_OUTPATIENT_CLINIC_OR_DEPARTMENT_OTHER)
Admission: RE | Admit: 2022-06-14 | Discharge: 2022-06-14 | Disposition: A | Discharge status: Home / Self Care | Payer: Medicare HMO | Source: Ambulatory Visit | Attending: Neurosurgery | Admitting: Neurosurgery

## 2022-06-14 DIAGNOSIS — M47812 Spondylosis without myelopathy or radiculopathy, cervical region: Secondary | ICD-10-CM

## 2022-06-25 ENCOUNTER — Ambulatory Visit (INDEPENDENT_AMBULATORY_CARE_PROVIDER_SITE_OTHER): Payer: Medicare HMO | Admitting: Psychiatry

## 2022-06-25 ENCOUNTER — Encounter (HOSPITAL_COMMUNITY): Payer: Self-pay | Admitting: Psychiatry

## 2022-06-25 VITALS — BP 126/82 | HR 70 | Temp 99.0°F | Ht 68.0 in | Wt 213.0 lb

## 2022-06-25 DIAGNOSIS — F411 Generalized anxiety disorder: Secondary | ICD-10-CM

## 2022-06-25 DIAGNOSIS — F319 Bipolar disorder, unspecified: Secondary | ICD-10-CM

## 2022-06-25 DIAGNOSIS — F4310 Post-traumatic stress disorder, unspecified: Secondary | ICD-10-CM
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
## 2022-07-31 ENCOUNTER — Other Ambulatory Visit: Payer: Self-pay | Admitting: Neurosurgery

## 2022-08-11 ENCOUNTER — Other Ambulatory Visit (HOSPITAL_COMMUNITY): Payer: Medicare HMO

## 2022-09-09 ENCOUNTER — Other Ambulatory Visit: Payer: Self-pay | Admitting: Neurosurgery

## 2022-09-24 ENCOUNTER — Ambulatory Visit (INDEPENDENT_AMBULATORY_CARE_PROVIDER_SITE_OTHER): Payer: Medicare HMO | Admitting: Psychiatry

## 2022-09-24 ENCOUNTER — Encounter (HOSPITAL_COMMUNITY): Payer: Self-pay | Admitting: Psychiatry

## 2022-09-24 VITALS — BP 132/92 | HR 65 | Ht 69.0 in | Wt 213.0 lb

## 2022-09-24 DIAGNOSIS — F411 Generalized anxiety disorder: Secondary | ICD-10-CM

## 2022-09-24 DIAGNOSIS — F319 Bipolar disorder, unspecified: Secondary | ICD-10-CM

## 2022-09-24 DIAGNOSIS — F4310 Post-traumatic stress disorder, unspecified: Secondary | ICD-10-CM | POA: Diagnosis not present

## 2022-09-24 MED ORDER — BUPROPION HCL ER (XL) 300 MG PO TB24: 300.0000 mg | ORAL_TABLET | Freq: Every day | ORAL | 0 refills | Status: DC

## 2022-09-24 MED ORDER — TRAZODONE HCL 50 MG PO TABS: 50.0000 mg | ORAL_TABLET | Freq: Every day | ORAL | 0 refills | Status: DC

## 2022-09-24 NOTE — Progress Notes (Signed)
Surgical Instructions    Your procedure is scheduled on Thursday August 1st.  Report to [facility] Main Entrance "A" at 5:30 A.M., then check in with the Admitting office.  Call this number if you have problems the morning of surgery:  [phone]   If you have any questions prior to your surgery date call [phone]: Open Monday-Friday 8am-4pm If you experience any cold or flu symptoms such as cough, fever, chills, shortness of breath, etc. between now and your scheduled surgery, please notify us at the above number     Remember:  Do not eat or drink after midnight the night before your surgery    Take these medicines the morning of surgery with A SIP OF WATER: atorvastatin (LIPITOR) 20 MG tablet  buPROPion (WELLBUTRIN XL) 300 MG 24 hr tablet  levothyroxine (SYNTHROID) 112 MCG tablet  pantoprazole (PROTONIX) 20 MG tablet  POTASSIUM PO  Tenapanor HCl (IBSRELA) 50 MG TABS   IF NEEDED  ALPRAZolam (XANAX) 0.5 MG tablet  Polyethylene Glycol 400 (BLINK TEARS) 0.25 % SOLN  rizatriptan (MAXALT) 10 MG tablet     As of today, STOP taking any Aspirin (unless otherwise instructed by your surgeon) Aleve, Naproxen, Ibuprofen, Motrin, Advil, Goody's, BC's, all herbal medications, fish oil, and all vitamins.             [facility] is not responsible for any belongings or valuables.    Do NOT Smoke (Tobacco/Vaping)  24 hours prior to your procedure  If you use a CPAP at night, you may bring your mask for your overnight stay.   Contacts, glasses, hearing aids, dentures or partials may not be worn into surgery, please bring cases for these belongings   For patients admitted to the hospital, discharge time will be determined by your treatment team.   Patients discharged the day of surgery will not be allowed to drive home, and someone needs to stay with them for 24 hours.   SURGICAL WAITING ROOM VISITATION Patients having surgery or a procedure may have no more than 2 support people  in the waiting area - these visitors may rotate.   Children under the age of 23 must have an adult with them who is not the patient. If the patient needs to stay at the hospital during part of their recovery, the visitor guidelines for inpatient rooms apply. Pre-op nurse will coordinate an appropriate time for 1 support person to accompany patient in pre-op.  This support person may not rotate.   Please refer to [email or web address] for the visitor guidelines for Inpatients (after your surgery is over and you are in a regular room).      Pre-operative 5 CHG Bath Instructions   You can play a key role in reducing the risk of infection after surgery. Your skin needs to be as free of germs as possible. You can reduce the number of germs on your skin by washing with CHG (chlorhexidine gluconate) soap before surgery. CHG is an antiseptic soap that kills germs and continues to kill germs even after washing.   DO NOT use if you have an allergy to chlorhexidine/CHG or antibacterial soaps. If your skin becomes reddened or irritated, stop using the CHG and notify one of our RNs at [phone].   Please shower with the CHG soap starting 4 days before surgery using the following schedule:     Please keep in mind the following:  DO NOT shave, including legs and underarms, starting the day of your first shower.  You may shave your face at any point before/day of surgery.  Place clean sheets on your bed the day you start using CHG soap. Use a clean washcloth (not used since being washed) for each shower. DO NOT sleep with pets once you start using the CHG.   CHG Shower Instructions:  If you choose to wash your hair and private area, wash first with your normal shampoo/soap.  After you use shampoo/soap, rinse your hair and body thoroughly to remove shampoo/soap residue.  Turn the water OFF and apply about 3 tablespoons (45 ml) of CHG soap to a CLEAN  washcloth.  Apply CHG soap ONLY FROM YOUR NECK DOWN TO YOUR TOES (washing for 3-5 minutes)  DO NOT use CHG soap on face, private areas, open wounds, or sores.  Pay special attention to the area where your surgery is being performed.  If you are having back surgery, having someone wash your back for you may be helpful. Wait 2 minutes after CHG soap is applied, then you may rinse off the CHG soap.  Pat dry with a clean towel  Put on clean clothes/pajamas   If you choose to wear lotion, please use ONLY the CHG-compatible lotions on the back of this paper.     Additional instructions for the day of surgery: DO NOT APPLY any lotions, deodorants, cologne, or perfumes.   Put on clean/comfortable clothes.  Brush your teeth.  Ask your nurse before applying any prescription medications to the skin.      CHG Compatible Lotions   Aveeno Moisturizing lotion  Cetaphil Moisturizing Cream  Cetaphil Moisturizing Lotion  Clairol Herbal Essence Moisturizing Lotion, Dry Skin  Clairol Herbal Essence Moisturizing Lotion, Extra Dry Skin  Clairol Herbal Essence Moisturizing Lotion, Normal Skin  Curel Age Defying Therapeutic Moisturizing Lotion with Alpha Hydroxy  Curel Extreme Care Body Lotion  Curel Soothing Hands Moisturizing Hand Lotion  Curel Therapeutic Moisturizing Cream, Fragrance-Free  Curel Therapeutic Moisturizing Lotion, Fragrance-Free  Curel Therapeutic Moisturizing Lotion, Original Formula  Eucerin Daily Replenishing Lotion  Eucerin Dry Skin Therapy Plus Alpha Hydroxy Crme  Eucerin Dry Skin Therapy Plus Alpha Hydroxy Lotion  Eucerin Original Crme  Eucerin Original Lotion  Eucerin Plus Crme Eucerin Plus Lotion  Eucerin TriLipid Replenishing Lotion  Keri Anti-Bacterial Hand Lotion  Keri Deep Conditioning Original Lotion Dry Skin Formula Softly Scented  Keri Deep Conditioning Original Lotion, Fragrance Free Sensitive Skin Formula  Keri Lotion Fast Absorbing Fragrance Free Sensitive  Skin Formula  Keri Lotion Fast Absorbing Softly Scented Dry Skin Formula  Keri Original Lotion  Keri Skin Renewal Lotion Keri Silky Smooth Lotion  Keri Silky Smooth Sensitive Skin Lotion  Nivea Body Creamy Conditioning Oil  Nivea Body Extra Enriched Lotion  Nivea Body Original Lotion  Nivea Body Sheer Moisturizing Lotion Nivea Crme  Nivea Skin Firming Lotion  NutraDerm 30 Skin Lotion  NutraDerm Skin Lotion  NutraDerm Therapeutic Skin Cream  NutraDerm Therapeutic Skin Lotion  ProShield Protective Hand Cream  Provon moisturizing lotion   Day of Surgery:  Take a shower with CHG soap. Wear Clean/Comfortable clothing the morning of surgery Do not apply any deodorants/lotions.   Remember to brush your teeth WITH YOUR REGULAR TOOTHPASTE.  Do not wear jewelry or makeup. Do not wear lotions, powders, perfumes or deodorant. Do not shave 48 hours prior to surgery.   Do not bring valuables to the hospital. Do not wear nail polish, gel polish, artificial nails, or any other type of covering on natural nails (fingers and toes)  If you have artificial nails or gel coating that need to be removed by a nail salon, please have this removed prior to surgery. Artificial nails or gel coating may interfere with anesthesia's ability to adequately monitor your vital signs.  Special instructions:    Oral Hygiene is also important to reduce your risk of infection.  Remember - BRUSH YOUR TEETH THE MORNING OF SURGERY WITH YOUR REGULAR TOOTHPASTE   If you received a COVID test during your pre-op visit, it is requested that you wear a mask when out in public, stay away from anyone that may not be feeling well, and notify your surgeon if you develop symptoms. If you have been in contact with anyone that has tested positive in the last 10 days, please notify your surgeon.    Please read over the following fact sheets that you were given.
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
## 2022-09-25 ENCOUNTER — Encounter (HOSPITAL_COMMUNITY): Payer: Self-pay | Admitting: Vascular Surgery

## 2022-09-25 ENCOUNTER — Encounter (HOSPITAL_COMMUNITY)
Admission: RE | Admit: 2022-09-25 | Discharge: 2022-09-25 | Disposition: A | Discharge status: Home / Self Care | Payer: Medicare HMO | Source: Ambulatory Visit | Attending: Neurosurgery | Admitting: Neurosurgery

## 2022-09-25 ENCOUNTER — Other Ambulatory Visit: Payer: Self-pay

## 2022-09-25 ENCOUNTER — Encounter (HOSPITAL_COMMUNITY): Payer: Self-pay

## 2022-09-25 DIAGNOSIS — I129 Hypertensive chronic kidney disease with stage 1 through stage 4 chronic kidney disease, or unspecified chronic kidney disease: Secondary | ICD-10-CM | POA: Insufficient documentation

## 2022-09-25 DIAGNOSIS — E890 Postprocedural hypothyroidism: Secondary | ICD-10-CM | POA: Insufficient documentation

## 2022-09-25 DIAGNOSIS — Z01810 Encounter for preprocedural cardiovascular examination: Secondary | ICD-10-CM | POA: Insufficient documentation

## 2022-09-25 DIAGNOSIS — K219 Gastro-esophageal reflux disease without esophagitis: Secondary | ICD-10-CM | POA: Insufficient documentation

## 2022-09-25 DIAGNOSIS — I2510 Atherosclerotic heart disease of native coronary artery without angina pectoris: Secondary | ICD-10-CM

## 2022-09-25 DIAGNOSIS — Z01818 Encounter for other preprocedural examination: Secondary | ICD-10-CM | POA: Diagnosis present

## 2022-09-25 DIAGNOSIS — F1090 Alcohol use, unspecified, uncomplicated: Secondary | ICD-10-CM

## 2022-09-25 DIAGNOSIS — Z01812 Encounter for preprocedural laboratory examination: Secondary | ICD-10-CM | POA: Insufficient documentation

## 2022-09-25 DIAGNOSIS — Z789 Other specified health status: Secondary | ICD-10-CM

## 2022-09-25 DIAGNOSIS — I10 Essential (primary) hypertension: Secondary | ICD-10-CM

## 2022-09-25 DIAGNOSIS — N182 Chronic kidney disease, stage 2 (mild): Secondary | ICD-10-CM | POA: Insufficient documentation

## 2022-09-25 DIAGNOSIS — R9431 Abnormal electrocardiogram [ECG] [EKG]: Secondary | ICD-10-CM | POA: Insufficient documentation

## 2022-09-25 LAB — SURGICAL PCR SCREEN
MRSA, PCR: NEGATIVE
Staphylococcus aureus: POSITIVE — AB

## 2022-09-25 NOTE — Pre-Procedure Instructions (Signed)
Surgical Instructions   Your procedure is scheduled on October 01, 2022. Report to [facility] Main Entrance "A" at 5:30 A.M., then check in with the Admitting office. Any questions or running late day of surgery: call [phone]  Questions prior to your surgery date: call [phone], Monday-Friday, 8am-4pm. If you experience any cold or flu symptoms such as cough, fever, chills, shortness of breath, etc. between now and your scheduled surgery, please notify us at the above number.     Remember:  Do not eat or drink after midnight the night before your surgery    Take these medicines the morning of surgery with A SIP OF WATER: atorvastatin (LIPITOR)  buPROPion (WELLBUTRIN XL)  levothyroxine (SYNTHROID)  pantoprazole (PROTONIX)  Tenapanor HCl (IBSRELA)    May take these medicines IF NEEDED: ALPRAZolam (XANAX)  cyclobenzaprine (FLEXERIL)  Polyethylene Glycol 400 (BLINK TEARS)  rizatriptan (MAXALT)    One week prior to surgery, STOP taking any Aspirin (unless otherwise instructed by your surgeon) Aleve, Naproxen, Ibuprofen, Motrin, Advil, Goody's, BC's, all herbal medications, fish oil, and non-prescription vitamins.                     Do NOT Smoke (Tobacco/Vaping) for 24 hours prior to your procedure.  If you use a CPAP at night, you may bring your mask/headgear for your overnight stay.   You will be asked to remove any contacts, glasses, piercing's, hearing aid's, dentures/partials prior to surgery. Please bring cases for these items if needed.    Patients discharged the day of surgery will not be allowed to drive home, and someone needs to stay with them for 24 hours.  SURGICAL WAITING ROOM VISITATION Patients may have no more than 2 support people in the waiting area - these visitors may rotate.   Pre-op nurse will coordinate an appropriate time for 1 ADULT support person, who may not rotate, to accompany patient in pre-op.  Children under the age of 71 must have an  adult with them who is not the patient and must remain in the main waiting area with an adult.  If the patient needs to stay at the hospital during part of their recovery, the visitor guidelines for inpatient rooms apply.  Please refer to the [facility] website for the visitor guidelines for any additional information.   If you received a COVID test during your pre-op visit  it is requested that you wear a mask when out in public, stay away from anyone that may not be feeling well and notify your surgeon if you develop symptoms. If you have been in contact with anyone that has tested positive in the last 10 days please notify you surgeon.      Pre-operative 5 CHG Bathing Instructions   You can play a key role in reducing the risk of infection after surgery. Your skin needs to be as free of germs as possible. You can reduce the number of germs on your skin by washing with CHG (chlorhexidine gluconate) soap before surgery. CHG is an antiseptic soap that kills germs and continues to kill germs even after washing.   DO NOT use if you have an allergy to chlorhexidine/CHG or antibacterial soaps. If your skin becomes reddened or irritated, stop using the CHG and notify one of our RNs at [phone].   Please shower with the CHG soap starting 4 days before surgery using the following schedule:     Please keep in mind the following:  DO NOT shave,  including legs and underarms, starting the day of your first shower.   You may shave your face at any point before/day of surgery.  Place clean sheets on your bed the day you start using CHG soap. Use a clean washcloth (not used since being washed) for each shower. DO NOT sleep with pets once you start using the CHG.   CHG Shower Instructions:  If you choose to wash your hair and private area, wash first with your normal shampoo/soap.  After you use shampoo/soap, rinse your hair and body thoroughly to remove shampoo/soap residue.  Turn the water  OFF and apply about 3 tablespoons (45 ml) of CHG soap to a CLEAN washcloth.  Apply CHG soap ONLY FROM YOUR NECK DOWN TO YOUR TOES (washing for 3-5 minutes)  DO NOT use CHG soap on face, private areas, open wounds, or sores.  Pay special attention to the area where your surgery is being performed.  If you are having back surgery, having someone wash your back for you may be helpful. Wait 2 minutes after CHG soap is applied, then you may rinse off the CHG soap.  Pat dry with a clean towel  Put on clean clothes/pajamas   If you choose to wear lotion, please use ONLY the CHG-compatible lotions on the back of this paper.   Additional instructions for the day of surgery: DO NOT APPLY any lotions, deodorants, cologne, or perfumes.   Do not bring valuables to the hospital. [facility] is not responsible for any belongings/valuables. Do not wear nail polish, gel polish, artificial nails, or any other type of covering on natural nails (fingers and toes) Do not wear jewelry or makeup Put on clean/comfortable clothes.  Please brush your teeth.  Ask your nurse before applying any prescription medications to the skin.     CHG Compatible Lotions   Aveeno Moisturizing lotion  Cetaphil Moisturizing Cream  Cetaphil Moisturizing Lotion  Clairol Herbal Essence Moisturizing Lotion, Dry Skin  Clairol Herbal Essence Moisturizing Lotion, Extra Dry Skin  Clairol Herbal Essence Moisturizing Lotion, Normal Skin  Curel Age Defying Therapeutic Moisturizing Lotion with Alpha Hydroxy  Curel Extreme Care Body Lotion  Curel Soothing Hands Moisturizing Hand Lotion  Curel Therapeutic Moisturizing Cream, Fragrance-Free  Curel Therapeutic Moisturizing Lotion, Fragrance-Free  Curel Therapeutic Moisturizing Lotion, Original Formula  Eucerin Daily Replenishing Lotion  Eucerin Dry Skin Therapy Plus Alpha Hydroxy Crme  Eucerin Dry Skin Therapy Plus Alpha Hydroxy Lotion  Eucerin Original Crme  Eucerin Original  Lotion  Eucerin Plus Crme Eucerin Plus Lotion  Eucerin TriLipid Replenishing Lotion  Keri Anti-Bacterial Hand Lotion  Keri Deep Conditioning Original Lotion Dry Skin Formula Softly Scented  Keri Deep Conditioning Original Lotion, Fragrance Free Sensitive Skin Formula  Keri Lotion Fast Absorbing Fragrance Free Sensitive Skin Formula  Keri Lotion Fast Absorbing Softly Scented Dry Skin Formula  Keri Original Lotion  Keri Skin Renewal Lotion Keri Silky Smooth Lotion  Keri Silky Smooth Sensitive Skin Lotion  Nivea Body Creamy Conditioning Oil  Nivea Body Extra Enriched Lotion  Nivea Body Original Lotion  Nivea Body Sheer Moisturizing Lotion Nivea Crme  Nivea Skin Firming Lotion  NutraDerm 30 Skin Lotion  NutraDerm Skin Lotion  NutraDerm Therapeutic Skin Cream  NutraDerm Therapeutic Skin Lotion  ProShield Protective Hand Cream  Provon moisturizing lotion  Please read over the following fact sheets that you were given.

## 2022-09-25 NOTE — Progress Notes (Addendum)
PCP - Dr. Truett Perna Cardiologist - Dr. Genella Rife [place]a (LOV 03/12/2021 with PRN follow-up) Nephrologist - Dr. Barnabas Lister   PPM/ICD - Denies Device Orders - n/a Rep Notified - n/a  Chest x-ray - Denies  EKG - 09/25/2022 Stress Test - Many years ago per pt. Result normal ECHO - Denies Cardiac Cath - In 2022 in [place]. Records Requested   Sleep Study - +OSA when she was heavier. Had a more recent study since she has lost weight and no longer OSA+  No DM  Last dose of GLP1 agonist- n/a   GLP1 instructions: n/a  Blood Thinner Instructions: n/a Aspirin Instructions: n/a  NPO after midnight   COVID TEST- n/a   Anesthesia review: Yes. Hx HTN, CKD. Cardiac cath records requested from [facility].   Patient denies shortness of breath, fever, cough and chest pain at PAT appointment. Pt denies any respiratory illness/infection in the last two months.   All instructions explained to the patient, with a verbal understanding of the material. Patient agrees to go over the instructions while at home for a better understanding. Patient also instructed to self quarantine after being tested for COVID-19. The opportunity to ask questions was provided.

## 2022-09-26 ENCOUNTER — Encounter (HOSPITAL_BASED_OUTPATIENT_CLINIC_OR_DEPARTMENT_OTHER): Payer: Self-pay

## 2022-09-26 ENCOUNTER — Emergency Department (HOSPITAL_BASED_OUTPATIENT_CLINIC_OR_DEPARTMENT_OTHER): Payer: Medicare HMO

## 2022-09-26 ENCOUNTER — Other Ambulatory Visit: Payer: Self-pay

## 2022-09-26 ENCOUNTER — Emergency Department (HOSPITAL_BASED_OUTPATIENT_CLINIC_OR_DEPARTMENT_OTHER)
Admission: EM | Admit: 2022-09-26 | Discharge: 2022-09-26 | Disposition: A | Discharge status: Home / Self Care | Payer: Medicare HMO | Attending: Emergency Medicine | Admitting: Emergency Medicine

## 2022-09-26 DIAGNOSIS — N183 Chronic kidney disease, stage 3 unspecified: Secondary | ICD-10-CM | POA: Insufficient documentation

## 2022-09-26 DIAGNOSIS — Z85850 Personal history of malignant neoplasm of thyroid: Secondary | ICD-10-CM | POA: Insufficient documentation

## 2022-09-26 DIAGNOSIS — E039 Hypothyroidism, unspecified: Secondary | ICD-10-CM | POA: Insufficient documentation

## 2022-09-26 DIAGNOSIS — Z79899 Other long term (current) drug therapy: Secondary | ICD-10-CM | POA: Diagnosis not present

## 2022-09-26 DIAGNOSIS — R519 Headache, unspecified: Secondary | ICD-10-CM | POA: Diagnosis not present

## 2022-09-26 DIAGNOSIS — G8929 Other chronic pain: Secondary | ICD-10-CM | POA: Insufficient documentation

## 2022-09-26 HISTORY — DX: Migraine, unspecified, not intractable, without status migrainosus: G43.909

## 2022-09-26 MED ORDER — SODIUM CHLORIDE 0.9 % IV BOLUS
1000.0000 mL | Freq: Once | INTRAVENOUS | Status: AC
  Administered 2022-09-26: 1000 mL via INTRAVENOUS

## 2022-09-26 MED ORDER — KETOROLAC TROMETHAMINE 15 MG/ML IJ SOLN
7.5000 mg | Freq: Once | INTRAMUSCULAR | Status: AC
  Administered 2022-09-26: 7.5 mg via INTRAVENOUS
  Filled 2022-09-26: qty 1

## 2022-09-26 MED ORDER — METOCLOPRAMIDE HCL 5 MG/ML IJ SOLN
10.0000 mg | Freq: Once | INTRAMUSCULAR | Status: AC
  Administered 2022-09-26: 10 mg via INTRAVENOUS
  Filled 2022-09-26: qty 2

## 2022-09-26 MED ORDER — DIPHENHYDRAMINE HCL 50 MG/ML IJ SOLN
25.0000 mg | Freq: Once | INTRAMUSCULAR | Status: AC
  Administered 2022-09-26: 25 mg via INTRAVENOUS
  Filled 2022-09-26: qty 1

## 2022-09-26 NOTE — ED Provider Notes (Signed)
[facility] Provider Note: Lowella Dell, MD, FACEP  CSN: [id number] MRN: [id number] ARRIVAL: 09/26/22 at 0341 ROOM: MH02/MH02   CHIEF COMPLAINT  Headache   HISTORY OF PRESENT ILLNESS  09/26/22 3:54 AM Yolanda Jordan is a 57 y.o. female with several months of persistent almost daily headaches.  The headaches involve the entire head and are associated with nausea and photophobia.  She has not gotten relief with Maxalt or Zofran.  Her PCP has referred her to a neurologist but she has not yet followed up.  She has not had any imaging of her brain.  She is here with a headache which she rates as an 8 out of 10.  Again it is associated with nausea, photophobia and as well as blurred vision.  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  She tested positive for staph but negative for MRSA.  This was done by a nasal swab.  She is not having any nasal symptoms.   Past Medical History:  Diagnosis Date   Anxiety    Back pain    Cancer (HCC) 2000   thyroid cancer   Chronic kidney disease, stage 3 (HCC)    Constipation    Depression    Drug use    Dyspnea    panic attacks, high humidity   GERD (gastroesophageal reflux disease)    High blood pressure    No HTN as of 2024   Hx of thyroid cancer    Hypothyroidism    Joint pain    Memory loss    Migraines    PONV (postoperative nausea and vomiting)    Sleep apnea    Hx. Negative study 2023   SOB (shortness of breath)    Thyroid disease    Vitamin D deficiency     Past Surgical History:  Procedure Laterality Date   APPENDECTOMY     BLADDER SUSPENSION N/A 01/03/2016   Procedure: TRANSVAGINAL TAPE (TVT) PROCEDURE;  Surgeon: Lavina Hamman, MD;  Location: [facility];  Service: Gynecology;  Laterality: N/A;   CESAREAN SECTION     x2   CYSTOSCOPY N/A 01/03/2016   Procedure: CYSTOSCOPY;  Surgeon: Lavina Hamman, MD;  Location: [facility];   Service: Gynecology;  Laterality: N/A;   HEMORROIDECTOMY     age 69   LASIK Bilateral    PERINEOPLASTY N/A 01/03/2016   Procedure: PERINEOPLASTY;  Surgeon: Lavina Hamman, MD;  Location: [facility];  Service: Gynecology;  Laterality: N/A;   THYROIDECTOMY  2000   TUBAL LIGATION     uterine ablation     05/2010   WISDOM TOOTH EXTRACTION      Family History  Problem Relation Age of Onset   Healthy Mother    Stroke Mother    Depression Mother    Hypertension Father    Diabetes Father    Renal Disease Father    High Cholesterol Father    Heart disease Father    Kidney disease Father    Anxiety disorder Father    Bipolar disorder Father    Sleep apnea Father    Obesity Father     Social History   Tobacco Use   Smoking status: Never   Smokeless tobacco: Never  Vaping Use   Vaping status: Former  Substance Use Topics   Alcohol use: Never   Drug use: Not Currently    Types: Marijuana    Prior  to Admission medications   Medication Sig Start Date End Date Taking? Authorizing Provider  ALPRAZolam Prudy Feeler) 1 MG tablet Take 0.5-1 mg by mouth 2 (two) times daily as needed for anxiety.    [provider]  ascorbic acid (VITAMIN C) 500 MG tablet Take 500 mg by mouth daily.    [provider]  atorvastatin (LIPITOR) 20 MG tablet Take 20 mg by mouth daily. 02/24/22   [provider]  buPROPion (WELLBUTRIN XL) 300 MG 24 hr tablet Take 1 tablet (300 mg total) by mouth daily. 09/24/22   Thresa Ross, MD  Calcium Carb-Cholecalciferol (CALCIUM 500/VITAMIN D PO) Take 2 each by mouth daily. Gummies    [provider]  Cholecalciferol 50 MCG (2000 UT) CAPS Take 2,000 Units by mouth daily.    [provider]  Cyanocobalamin (B-12 PO) Take 1 tablet by mouth daily.    [provider]  famotidine (PEPCID) 20 MG tablet Take 20 mg by mouth 2 (two) times daily.    [provider]  fluticasone (FLONASE) 50 MCG/ACT nasal spray Place 1 spray  into the nose daily. 09/08/22 09/08/23  [provider]  levothyroxine (SYNTHROID) 112 MCG tablet Take 112 mcg by mouth daily before breakfast.    [provider]  nitroGLYCERIN (NITROSTAT) 0.4 MG SL tablet Place 0.4 mg under the tongue every 5 (five) minutes as needed for chest pain.    [provider]  ondansetron (ZOFRAN) 4 MG tablet Take 4-8 mg by mouth every 8 (eight) hours as needed for nausea or vomiting. 09/08/22   [provider]  Polyethylene Glycol 400 (BLINK TEARS) 0.25 % SOLN Place 1 drop into both eyes as needed (dry eyes).    [provider]  Potassium 99 MG TABS Take 99 mg by mouth every other day.    [provider]  propranolol (INDERAL) 10 MG tablet Take 10 mg by mouth daily. Migraine    [provider]  rizatriptan (MAXALT) 10 MG tablet Take 10 mg by mouth every 2 (two) hours as needed for migraine. 10/16/15   [provider]  Sennosides (EX-LAX) 15 MG CHEW Chew 30-45 mg by mouth daily as needed (constipation).    [provider]  Tenapanor HCl (IBSRELA) 50 MG TABS Take 50 mg by mouth in the morning and at bedtime.    [provider]  traZODone (DESYREL) 50 MG tablet Take 1 tablet (50 mg total) by mouth at bedtime. 09/24/22   Thresa Ross, MD    Allergies Buspirone, Ciprofloxacin, Gabapentin, and Morphine and codeine   REVIEW OF SYSTEMS  Negative except as noted here or in the History of Present Illness.   PHYSICAL EXAMINATION  Initial Vital Signs Blood pressure (!) 140/78, pulse 68, temperature 97.6 F (36.4 C), temperature source Oral, resp. rate 18, height 5\' 9"  (1.753 m), weight 95.3 kg, SpO2 100%.  Examination General: Well-developed, well-nourished female in no acute distress; appearance consistent with age of record HENT: normocephalic; atraumatic Eyes: pupils equal, round and reactive to light; extraocular muscles intact; photophobia Neck: supple Heart: regular rate and  rhythm Lungs: clear to auscultation bilaterally Abdomen: soft; nondistended; nontender; bowel sounds present Extremities: No deformity; full range of motion Neurologic: Awake, alert and oriented; motor function intact in all extremities and symmetric; no facial droop Skin: Warm and dry Psychiatric: Normal mood and affect   RESULTS  Summary of this visit's results, reviewed and interpreted by myself:   EKG Interpretation Date/Time:    Ventricular Rate:  PR Interval:    QRS Duration:    QT Interval:    QTC Calculation:   R Axis:      Text Interpretation:         Laboratory Studies: Results for orders placed or performed during the hospital encounter of 09/25/22 (from the past 24 hour(s))  Surgical pcr screen     Status: Abnormal   Collection Time: 09/25/22 11:30 AM   Specimen: Nasal Mucosa; Nasal Swab  Result Value Ref Range   MRSA, PCR NEGATIVE NEGATIVE   Staphylococcus aureus POSITIVE (A) NEGATIVE   Imaging Studies: CT Head Wo Contrast  Result Date: 09/26/2022 CLINICAL DATA:  Worsening headaches and blurry vision. History of thyroid cancer and memory loss. EXAM: CT HEAD WITHOUT CONTRAST TECHNIQUE: Contiguous axial images were obtained from the base of the skull through the vertex without intravenous contrast. RADIATION DOSE REDUCTION: This exam was performed according to the departmental dose-optimization program which includes automated exposure control, adjustment of the mA and/or kV according to patient size and/or use of iterative reconstruction technique. COMPARISON:  None Available. FINDINGS: Brain: No evidence of acute infarction, hemorrhage, hydrocephalus, extra-axial collection or mass lesion/mass effect. No old infarcts are seen. The basal cisterns are clear. Vascular: No hyperdense vessel or unexpected calcification. Skull: Negative for fractures or focal lesions. Right-sided deviation and spurring of the nasal septum. Sinuses/Orbits: No acute finding. Right-sided  deviation and spurring of the nasal septum. Other: None. IMPRESSION: 1. No acute intracranial findings. 2. Right-sided deviation and spurring of the nasal septum. Electronically Signed   By: Almira Bar M.D.   On: 09/26/2022 04:39    ED COURSE and MDM  Nursing notes, initial and subsequent vitals signs, including pulse oximetry, reviewed and interpreted by myself.  Vitals:   09/26/22 0351 09/26/22 0352 09/26/22 0443  BP: (!) 140/78  108/72  Pulse: 68  64  Resp: 18    Temp: 97.6 F (36.4 C)    TempSrc: Oral    SpO2: 100%  99%  Weight:  95.3 kg   Height:  5\' 9"  (1.753 m)    Medications  sodium chloride 0.9 % bolus 1,000 mL (1,000 mLs Intravenous New Bag/Given 09/26/22 0438)  diphenhydrAMINE (BENADRYL) injection 25 mg (25 mg Intravenous Given 09/26/22 0438)  metoCLOPramide (REGLAN) injection 10 mg (10 mg Intravenous Given 09/26/22 0441)  ketorolac (TORADOL) 15 MG/ML injection 7.5 mg (7.5 mg Intravenous Given 09/26/22 0440)   5:26 AM Patient's pain not significantly improved from baseline but she does feel some relief.  She is willing to go home at this time acknowledging that she has been having daily pain for months.  It is possible her pain is related to her cervical foraminal stenosis.  She was concerned about the Staphylococcus aureus colonization in her nose but she was advised that it was not MRSA and antibiotic treatment at this point, as she is asymptomatic, is not indicated as 20 to 80% of healthy people have Staph aureus nasal colonization.  CT scan is reassuring.   PROCEDURES  Procedures   ED DIAGNOSES     ICD-10-CM   1. Chronic daily headache  R51.9          Arno Cullers, Jonny Ruiz, MD 09/26/22 [phone]

## 2022-09-26 NOTE — ED Triage Notes (Addendum)
Pt with a couple months of migraines that will not go away. Pt has a neurologist she needs to see. Pt has PRN pain medication that is not helping. Pt has surgery coming up and she has a +Staph infection. Pt generally not feeling well "for a while". Pt endorses blurry vision; bright lights hurt her eyes.

## 2022-09-28 NOTE — Anesthesia Preprocedure Evaluation (Signed)
Anesthesia Evaluation    Airway        Dental   Pulmonary           Cardiovascular hypertension,      Neuro/Psych    GI/Hepatic   Endo/Other    Renal/GU      Musculoskeletal   Abdominal   Peds  Hematology   Anesthesia Other Findings   Reproductive/Obstetrics                              Anesthesia Physical Anesthesia Plan  ASA:   Anesthesia Plan:    Post-op Pain Management:    Induction:   PONV Risk Score and Plan:   Airway Management Planned:   Additional Equipment:   Intra-op Plan:   Post-operative Plan:   Informed Consent:   Plan Discussed with:   Anesthesia Plan Comments: (PAT note by Antionette Poles, PA-C: 57 year old with pertinent history including frequent migraines, GERD, CKD 2, postoperative hypothyroidism, HTN, anxiety, depression.  Patient had prior cardiology evaluation in 2022 for atypical chest pain.  She had a cath done at [facility] in [place], [place] on 08/19/2020 which showed angiographically normal coronaries, normal LV function, normal diastolic pressures.  Last seen by cardiologist Dr. Sharol Roussel at [facility] on 03/12/2021.  Per note, "At this time I suspect she may have microvascular, versus noncardiac. She periodically takes nitroglycerin which she says seems to help. We had a long talk and suspect a lot of this may be anxiety related she would like to just follow back with up as needed."  CMP 09/12/2022 in Care Everywhere reviewed, unremarkable, sodium 138, potassium 4.6, creatinine 1.17.  CBC 09/12/2022 in Care Everywhere reviewed, unremarkable, WBC 6.3, Hgb 14, HCT 41.5, PLT 276.  EKG 09/25/2022: NSR.  Rate 69.  LAD.  Cardiac cath 08/27/2020 (copy on chart): Impression: 1.  Normal left ventricular function. 2.  Normal diastolic pressures. 3.  Normal epicardial coronaries. 4.  Microvascular testing is not performed. 5.  No evidence of  valvular heart disease.   )         Anesthesia Quick Evaluation

## 2022-09-28 NOTE — Progress Notes (Signed)
Anesthesia Chart Review:  57 year old with pertinent history including frequent migraines, GERD, CKD 2, postoperative hypothyroidism, HTN, anxiety, depression.  Patient had prior cardiology evaluation in 2022 for atypical chest pain.  She had a cath done at [facility] in [place], [place] on 08/19/2020 which showed angiographically normal coronaries, normal LV function, normal diastolic pressures.  Last seen by cardiologist Dr. Sharol Roussel at [facility] on 03/12/2021.  Per note, "At this time I suspect she may have microvascular, versus noncardiac. She periodically takes nitroglycerin which she says seems to help. We had a long talk and suspect a lot of this may be anxiety related she would like to just follow back with up as needed."  CMP 09/12/2022 in Care Everywhere reviewed, unremarkable, sodium 138, potassium 4.6, creatinine 1.17.  CBC 09/12/2022 in Care Everywhere reviewed, unremarkable, WBC 6.3, Hgb 14, HCT 41.5, PLT 276.  EKG 09/25/2022: NSR.  Rate 69.  LAD.  Cardiac cath 08/27/2020 (copy on chart): Impression: 1.  Normal left ventricular function. 2.  Normal diastolic pressures. 3.  Normal epicardial coronaries. 4.  Microvascular testing is not performed. 5.  No evidence of valvular heart disease.    Zannie Cove [facility] Short Stay Center/Anesthesiology Phone [phone] 09/28/2022 12:34 PM

## 2022-10-01 ENCOUNTER — Ambulatory Visit (HOSPITAL_COMMUNITY): Admission: RE | Admit: 2022-10-01 | Payer: Medicare HMO | Source: Home / Self Care

## 2022-10-01 ENCOUNTER — Encounter (HOSPITAL_COMMUNITY): Admission: RE | Payer: Self-pay | Source: Home / Self Care

## 2022-10-01 SURGERY — POSTERIOR CERVICAL LAMINECTOMY
Anesthesia: General | Laterality: Right

## 2022-10-07 NOTE — Therapy (Unsigned)
OUTPATIENT PHYSICAL THERAPY CERVICAL EVALUATION   Patient Name: Yolanda Jordan MRN: [id number] DOB:Nov 28, 1965, 57 y.o., female Today's Date: 10/08/2022  END OF SESSION:  PT End of Session - 10/08/22 1126     Visit Number 1    Number of Visits 24    Date for PT Re-Evaluation 12/31/22    Authorization Type not available    PT Start Time 0930    PT Stop Time 1023    PT Time Calculation (min) 53 min    Activity Tolerance Patient tolerated treatment well             Past Medical History:  Diagnosis Date   Anxiety    Back pain    Cancer (HCC) 2000   thyroid cancer   Chronic kidney disease, stage 3 (HCC)    Constipation    Depression    Drug use    Dyspnea    panic attacks, high humidity   GERD (gastroesophageal reflux disease)    High blood pressure    No HTN as of 2024   Hx of thyroid cancer    Hypothyroidism    Joint pain    Memory loss    Migraines    PONV (postoperative nausea and vomiting)    Sleep apnea    Hx. Negative study 2023   SOB (shortness of breath)    Thyroid disease    Vitamin D deficiency    Past Surgical History:  Procedure Laterality Date   APPENDECTOMY     BLADDER SUSPENSION N/A 01/03/2016   Procedure: TRANSVAGINAL TAPE (TVT) PROCEDURE;  Surgeon: Lavina Hamman, MD;  Location: [facility];  Service: Gynecology;  Laterality: N/A;   CESAREAN SECTION     x2   CYSTOSCOPY N/A 01/03/2016   Procedure: CYSTOSCOPY;  Surgeon: Lavina Hamman, MD;  Location: [facility];  Service: Gynecology;  Laterality: N/A;   HEMORROIDECTOMY     age 89   LASIK Bilateral    PERINEOPLASTY N/A 01/03/2016   Procedure: PERINEOPLASTY;  Surgeon: Lavina Hamman, MD;  Location: [facility];  Service: Gynecology;  Laterality: N/A;   THYROIDECTOMY  2000   TUBAL LIGATION     uterine ablation     05/2010   WISDOM TOOTH EXTRACTION     Patient Active Problem List   Diagnosis Date Noted   Insulin resistance 07/18/2019   Class 2 severe obesity with serious comorbidity and body mass  index (BMI) of 37.0 to 37.9 in adult (HCC) 06/22/2019   Stage 3a chronic kidney disease (HCC) 05/09/2019   Essential hypertension 01/12/2018   Myopia with astigmatism and presbyopia, bilateral 07/03/2016   Family history of colonic polyps 06/30/2016   Gastroesophageal reflux disease without esophagitis 06/30/2016   Juvenile polyposis syndrome 06/30/2016   Prediabetes 09/24/2015   OSA (obstructive sleep apnea) 08/06/2015   Vitamin D deficiency 03/13/2015   H/O radioactive iodine thyroid ablation 12/03/2013   Migraine with aura and without status migrainosus, not intractable 05/10/2013   Bipolar 1 disorder, depressed (HCC) 11/04/2012   Hypothyroidism 11/04/2012   History of thyroid cancer 07/28/2011    PCP:  Dr Barnabas Lister  REFERRING PROVIDER: Clovis Riley, PA-C  REFERRING DIAG: Cervical radiculopathy   THERAPY DIAG:  Radiculopathy, cervical region  Cervicalgia  Other symptoms and signs involving the musculoskeletal system  Abnormal posture  Muscle weakness (generalized)  Rationale for Evaluation and Treatment: Rehabilitation  ONSET DATE: 11/30/21  SUBJECTIVE:  SUBJECTIVE STATEMENT: Patient reports that she was scheduled for therapy last week but cancelled and wants to try PT. Patient reports that she was painting ceilings in her house 10/23 and had significant pain in the neck which has persisted since that time with no significant change. Symptoms are better in the am and worse as she is moving around. Meds do not help. She has had epidural injections and nerve block in cervical spine with no change in symptoms.  Patient reports significant improvement with dry needling. She is pain free at the end of treatment.    Hand dominance: Right  PERTINENT HISTORY:  Thyroid  cancer; thyroid surgery 2000; heart burn; headaches; short term memory loss following childbirth - ECT treatment; retired on disability due to memory loss   PAIN:  Are you having pain? Yes: NPRS scale: 4-5/10; up to 7/10 Pain location: L neck area  Pain description: constant sharp  Aggravating factors: looking up; moving; bending; lifting Relieving factors: nothing   PRECAUTIONS: None  RED FLAGS: None     WEIGHT BEARING RESTRICTIONS: No  FALLS:  Has patient fallen in last 6 months? 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 C5-C6 mild spinal canal stenosis, with moderate to severe left and mild right neural foraminal narrowing. 2. C6-C7 mild spinal canal stenosis and mild right neural foraminal narrowing. 3. C4-C5 mild left neural foraminal narrowing.  CT head 09/26/22: 1. No acute intracranial findings. 2. Right-sided deviation and spurring of the nasal septum   PATIENT SURVEYS:  FOTO 29; goal 45  COGNITION: Overall cognitive status: Within functional limits for tasks assessed  SENSATION: WFL  POSTURE: Patient presents with head forward posture with increased thoracic kyphosis; shoulders rounded and elevated; scapulae abducted and rotated along the thoracic spine; head of the humerus anterior in orientation.   PALPATION: Muscular tightness in L > R ant/lat/posterior cervical musculature; pecs; upper trap; leveator; teres; upper thoracic paraspinals. Tightness and discomfort in the mid to upper thoracic and loser cervical spine with PA and lateral mobs    CERVICAL ROM:   Active ROM A/PROM (deg) eval  Flexion 44  Extension 20 pain L neck    Right  lateral flexion 20  Left lateral flexion 17 pain L neck  Right rotation 43  Left rotation 26 pain L neck   (Blank rows = not tested)  UPPER EXTREMITY ROM: end range tightness elevation   Active ROM Right eval Left eval  Shoulder flexion [facility]  Shoulder extension    Shoulder abduction [facility]  Shoulder adduction    Shoulder extension    Shoulder internal rotation    Shoulder external rotation    Elbow flexion    Elbow extension    Wrist flexion    Wrist extension    Wrist ulnar deviation    Wrist radial deviation    Wrist pronation    Wrist supination     (Blank rows = not tested)  UPPER EXTREMITY MMT: appears UE weakness is due to pain with resistive testing L shoulder flexion; abduction   MMT Right eval Left eval  Shoulder flexion 5 5-  Shoulder extension 5 5  Shoulder  abduction 5 4  Shoulder adduction    Shoulder extension    Shoulder internal rotation    Shoulder external rotation    Middle trapezius 4 4  Lower trapezius 4 4  Elbow flexion    Elbow extension    Wrist flexion    Wrist extension    Wrist ulnar deviation    Wrist radial deviation    Wrist pronation    Wrist supination    Grip strength     (Blank rows = not tested)   [facility] Adult PT Treatment:                                                DATE: 10/08/22 Therapeutic Exercise: Standing Chin tuck with noodle 5 sec x 5 Scap squeeze with noodle 5 sec x 5 L's with noodle 3 sec x 10  Doorway stretch 3 positions 20-30 sec hold x 2  Sitting  Sitting with noodle along spine to improve posture and alignment Supine  Chin tuck head supported on pillow 5 sec x 5  Manual Therapy: Skilled palpation to assess response to manual work and dry needling  Trigger Point Dry-Needling  Treatment instructions: Expect mild to moderate muscle soreness. S/S of pneumothorax if dry needled over a lung field, and to seek immediate medical attention should they occur. Patient verbalized understanding of these  instructions and education.  Patient Consent Given: Yes Education handout provided: Yes Muscles treated: L scaleni  Electrical stimulation performed: No Parameters: N/A Treatment response/outcome: twitch; decreased palpable tightness   Neuromuscular re-ed: Postural correction in sitting and standing  Modalities: Moist heat x 10 min  Has TENS unit at home which has not helped  Self Care: Use of noodle    PATIENT EDUCATION:  Education details: POC; HEP Person educated: Patient Education method: Explanation, Demonstration, Tactile cues, Verbal cues, and Handouts Education comprehension: verbalized understanding, returned demonstration, verbal cues required, tactile cues required, and needs further education  HOME EXERCISE PROGRAM: Access Code: [email or web address] URL: [email or web address] Date: 10/08/2022 Prepared by: Corlis Leak  Exercises - Seated Cervical Retraction  - 2 x daily - 7 x weekly - 1-2 sets - 5-10 reps - 10 sec  hold - Supine Cervical Retraction with Towel  - 2 x daily - 7 x weekly - 1 sets - 5-10 reps - 10 sec  hold - Seated Scapular Retraction  - 2 x daily - 7 x weekly - 1-2 sets - 10 reps - 10 sec  hold - Shoulder External Rotation and Scapular Retraction  - 3 x daily - 7 x weekly - 1 sets - 10 reps - 3-5 sec   hold - Doorway Pec Stretch at 60 Degrees Abduction  - 3 x daily - 7 x weekly - 1 sets - 3 reps - Doorway Pec Stretch at 90 Degrees Abduction  - 3 x daily - 7 x weekly - 1 sets - 3 reps - 30 seconds  hold - Doorway Pec Stretch at 120 Degrees Abduction  - 3 x daily - 7 x weekly - 1 sets - 3 reps - 30 second hold  hold  Patient Education - Trigger Point Dry Needling  ASSESSMENT:  CLINICAL IMPRESSION: Patient is a 57 y.o. female who was seen today for physical therapy evaluation and treatment for cervical radiculopathy. Onset of pain was related to looking up painting the ceiling  of her house. Patient has received several interventions for cervical  pain including 3 ESI's, nerve block, meds with no improvement. She presents clinically with poor posture and alignment; limited cervical and thoracic mobility/ROM; decreased postural strength; pain with functional activities; pain on a daily basis. Patient will benefit from PT to address problems identified. Excellent response to initial treatment including dry needling.   OBJECTIVE IMPAIRMENTS: decreased activity tolerance, decreased mobility, decreased ROM, decreased strength, hypomobility, increased fascial restrictions, increased muscle spasms, impaired flexibility, impaired UE functional use, improper body mechanics, postural dysfunction, and pain.   ACTIVITY LIMITATIONS: carrying, lifting, bending, sitting, standing, squatting, sleeping, and reach over head  PARTICIPATION LIMITATIONS: meal prep, cleaning, laundry, driving, shopping, and occupation  PERSONAL FACTORS: Past/current experiences and Time since onset of injury/illness/exacerbation are also affecting patient's functional outcome.   REHAB POTENTIAL: Good  CLINICAL DECISION MAKING: Stable/uncomplicated  EVALUATION COMPLEXITY: Low   GOALS: Goals reviewed with patient? Yes  SHORT TERM GOALS: Target date: 11/19/2022   Independent in initial HEP  Baseline:  Goal status: INITIAL  2.  Improve posture and alignment with patient to demonstrate improved upright posture Baseline:  Goal status: INITIAL   LONG TERM GOALS: Target date: 12/31/2022   Decrease pain in L cervical spine by 75-100% allowing patient to return to all normal functional activities  Baseline:  Goal status: INITIAL  2.  Increase AROM cervical spine to WNL's and pain free  Baseline:  Goal status: INITIAL  3.  5/5 strength L shoulder and posterior shoulder girdle musculature  Baseline:  Goal status: INITIAL  4.  Improve posterior shoulder girdle strength with patient to demonstrate improved upright posture with posterior shoulder girdle engaged   Baseline:  Goal status: INITIAL  5.  Independent in HEP including aquatic therapy program as indicated  Baseline:  Goal status: INITIAL  6.  Improve functional limitation score to 45 Baseline:  Goal status: INITIAL   PLAN:  PT FREQUENCY: 2x/week  PT DURATION: 12 weeks  PLANNED INTERVENTIONS: Therapeutic exercises, Therapeutic activity, Neuromuscular re-education, Patient/Family education, Self Care, Joint mobilization, Aquatic Therapy, Dry Needling, Electrical stimulation, Spinal mobilization, Cryotherapy, Moist heat, Taping, Traction, Ultrasound, Ionotophoresis 4mg /ml Dexamethasone, Manual therapy, and Re-evaluation  PLAN FOR NEXT SESSION: review and progress exercises; continue with spine care education and postural correction, manual work,DN,modalities as indicate     P , PT 10/08/2022, 11:27 AM

## 2022-10-08 ENCOUNTER — Encounter: Payer: Self-pay | Admitting: Rehabilitative and Restorative Service Providers"

## 2022-10-08 ENCOUNTER — Ambulatory Visit: Payer: Medicare HMO | Attending: Surgery | Admitting: Rehabilitative and Restorative Service Providers"

## 2022-10-08 ENCOUNTER — Other Ambulatory Visit: Payer: Self-pay

## 2022-10-08 DIAGNOSIS — R293 Abnormal posture: Secondary | ICD-10-CM | POA: Diagnosis not present

## 2022-10-08 DIAGNOSIS — R29898 Other symptoms and signs involving the musculoskeletal system: Secondary | ICD-10-CM | POA: Diagnosis not present

## 2022-10-08 DIAGNOSIS — M5412 Radiculopathy, cervical region: Secondary | ICD-10-CM

## 2022-10-08 DIAGNOSIS — M6281 Muscle weakness (generalized): Secondary | ICD-10-CM | POA: Diagnosis not present

## 2022-10-08 DIAGNOSIS — M542 Cervicalgia: Secondary | ICD-10-CM

## 2022-10-14 ENCOUNTER — Ambulatory Visit: Payer: Medicare HMO | Admitting: Rehabilitative and Restorative Service Providers"

## 2022-10-14 ENCOUNTER — Encounter: Payer: Self-pay | Admitting: Rehabilitative and Restorative Service Providers"

## 2022-10-14 DIAGNOSIS — M5412 Radiculopathy, cervical region: Secondary | ICD-10-CM | POA: Diagnosis not present

## 2022-10-14 DIAGNOSIS — R293 Abnormal posture: Secondary | ICD-10-CM

## 2022-10-14 DIAGNOSIS — R29898 Other symptoms and signs involving the musculoskeletal system: Secondary | ICD-10-CM

## 2022-10-14 DIAGNOSIS — M6281 Muscle weakness (generalized): Secondary | ICD-10-CM

## 2022-10-14 DIAGNOSIS — M542 Cervicalgia: Secondary | ICD-10-CM

## 2022-10-14 NOTE — Therapy (Signed)
OUTPATIENT PHYSICAL THERAPY CERVICAL EVALUATION   Patient Name: Yolanda Jordan MRN: [id number] DOB:[birth year], 57 y.o., female Today's Date: 10/14/2022  END OF SESSION:  PT End of Session - 10/14/22 0801     Visit Number 2    Number of Visits 24    Date for PT Re-Evaluation 12/31/22    Authorization Type not available    PT Start Time 0800    PT Stop Time 0848    PT Time Calculation (min) 48 min    Activity Tolerance Patient tolerated treatment well             Past Medical History:  Diagnosis Date   Anxiety    Back pain    Cancer (HCC) 2000   thyroid cancer   Chronic kidney disease, stage 3 (HCC)    Constipation    Depression    Drug use    Dyspnea    panic attacks, high humidity   GERD (gastroesophageal reflux disease)    High blood pressure    No HTN as of 2024   Hx of thyroid cancer    Hypothyroidism    Joint pain    Memory loss    Migraines    PONV (postoperative nausea and vomiting)    Sleep apnea    Hx. Negative study 2023   SOB (shortness of breath)    Thyroid disease    Vitamin D deficiency    Past Surgical History:  Procedure Laterality Date   APPENDECTOMY     BLADDER SUSPENSION N/A 01/03/2016   Procedure: TRANSVAGINAL TAPE (TVT) PROCEDURE;  Surgeon: Lavina Hamman, MD;  Location: [facility];  Service: Gynecology;  Laterality: N/A;   CESAREAN SECTION     x2   CYSTOSCOPY N/A 01/03/2016   Procedure: CYSTOSCOPY;  Surgeon: Lavina Hamman, MD;  Location: [facility];  Service: Gynecology;  Laterality: N/A;   HEMORROIDECTOMY     age 80   LASIK Bilateral    PERINEOPLASTY N/A 01/03/2016   Procedure: PERINEOPLASTY;  Surgeon: Lavina Hamman, MD;  Location: [facility];  Service: Gynecology;  Laterality: N/A;   THYROIDECTOMY  2000   TUBAL LIGATION     uterine ablation     05/2010   WISDOM TOOTH EXTRACTION     Patient Active Problem List   Diagnosis Date Noted   Insulin resistance 07/18/2019   Class 2 severe obesity with serious comorbidity and body mass  index (BMI) of 37.0 to 37.9 in adult (HCC) 06/22/2019   Stage 3a chronic kidney disease (HCC) 05/09/2019   Essential hypertension 01/12/2018   Myopia with astigmatism and presbyopia, bilateral 07/03/2016   Family history of colonic polyps 06/30/2016   Gastroesophageal reflux disease without esophagitis 06/30/2016   Juvenile polyposis syndrome 06/30/2016   Prediabetes 09/24/2015   OSA (obstructive sleep apnea) 08/06/2015   Vitamin D deficiency 03/13/2015   H/O radioactive iodine thyroid ablation 12/03/2013   Migraine with aura and without status migrainosus, not intractable 05/10/2013   Bipolar 1 disorder, depressed (HCC) 11/04/2012   Hypothyroidism 11/04/2012   History of thyroid cancer 07/28/2011    PCP:  Dr Barnabas Lister  REFERRING PROVIDER: Clovis Riley, PA-C  REFERRING DIAG: Cervical radiculopathy   THERAPY DIAG:  Radiculopathy, cervical region  Cervicalgia  Other symptoms and signs involving the musculoskeletal system  Abnormal posture  Muscle weakness (generalized)  Rationale for Evaluation and Treatment: Rehabilitation  ONSET DATE: 11/30/21  SUBJECTIVE:  SUBJECTIVE STATEMENT: Patient reports some soreness following initial treatment. She has worked on her exercises at home.   Eval: Patient reports that she was scheduled for therapy last week but cancelled and wants to try PT. Patient reports that she was painting ceilings in her house 10/23 and had significant pain in the neck which has persisted since that time with no significant change. Symptoms are better in the am and worse as she is moving around. Meds do not help. She has had epidural injections and nerve block in cervical spine with no change in symptoms.  Patient reports significant improvement with dry  needling. She is pain free at the end of treatment.    Hand dominance: Right  PERTINENT HISTORY:  Thyroid cancer; thyroid surgery 2000; heart burn; headaches; short term memory loss following childbirth - ECT treatment; retired on disability due to memory loss   PAIN:  Are you having pain? Yes: NPRS scale: 3-4/10; up to 7/10 Pain location: L neck area  Pain description: constant sharp  Aggravating factors: looking up; moving; bending; lifting Relieving factors: nothing   PRECAUTIONS: None   WEIGHT BEARING RESTRICTIONS: No  FALLS:  Has patient fallen in last 6 months? 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 C5-C6 mild spinal canal stenosis, with moderate to severe left and mild right neural foraminal narrowing. 2. C6-C7 mild spinal canal stenosis and mild right neural foraminal narrowing. 3. C4-C5 mild left neural foraminal narrowing.  CT head 09/26/22: 1. No acute intracranial findings. 2. Right-sided deviation and spurring of the nasal septum   PATIENT SURVEYS:  FOTO 29; goal 45  SENSATION: WFL  POSTURE: Patient presents with head forward posture with increased thoracic kyphosis; shoulders rounded and elevated; scapulae abducted and rotated along the thoracic spine; head of the humerus anterior in orientation.   PALPATION: Muscular tightness in L > R ant/lat/posterior cervical musculature; pecs; upper trap; leveator; teres; upper thoracic paraspinals. Tightness and discomfort in the mid to upper thoracic and loser cervical spine with PA and lateral mobs    CERVICAL ROM:   Active ROM A/PROM (deg) eval  Flexion 44  Extension 20 pain L neck    Right lateral flexion  20  Left lateral flexion 17 pain L neck  Right rotation 43  Left rotation 26 pain L neck   (Blank rows = not tested)  UPPER EXTREMITY ROM: end range tightness elevation   Active ROM Right eval Left eval  Shoulder flexion [facility]  Shoulder extension    Shoulder abduction [facility]  Shoulder adduction    Shoulder extension    Shoulder internal rotation    Shoulder external rotation    Elbow flexion    Elbow extension    Wrist flexion    Wrist extension    Wrist ulnar deviation    Wrist radial deviation    Wrist pronation    Wrist supination     (Blank rows = not tested)  UPPER EXTREMITY MMT: appears UE weakness is due to pain with resistive testing L shoulder flexion; abduction   MMT Right eval Left eval  Shoulder flexion 5 5-  Shoulder extension 5 5  Shoulder abduction  5 4  Shoulder adduction    Shoulder extension    Shoulder internal rotation    Shoulder external rotation    Middle trapezius 4 4  Lower trapezius 4 4  Elbow flexion    Elbow extension    Wrist flexion    Wrist extension    Wrist ulnar deviation    Wrist radial deviation    Wrist pronation    Wrist supination    Grip strength     (Blank rows = not tested)   [facility] Adult PT Treatment:                                                DATE: 10/08/22 Therapeutic Exercise: Standing Chin tuck with noodle 5 sec x 5 Scap squeeze with noodle 5 sec x 5 L's with noodle 3 sec x 10  L" with noodle yellow tb 3 sec x 10  W's with noodle yellow TB 3 sec x 10  Doorway stretch 3 positions 30 sec hold x 2  Sitting  Thoracic extension with coregeous ball  Sitting with noodle along spine to improve posture and alignment Supine  Chin tuck head supported on pillow 5 sec x 5  Manual Therapy: Skilled palpation to assess response to manual work and dry needling  Trigger Point Dry-Needling  Treatment instructions: Expect mild to moderate muscle soreness. S/S of pneumothorax if dry needled over a lung field, and to  seek immediate medical attention should they occur. Patient verbalized understanding of these instructions and education.  Patient Consent Given: Yes Education handout provided: Yes Muscles treated: L scaleni; suboccipitals; cervical paraspinals   Electrical stimulation performed: No Parameters: N/A Treatment response/outcome: twitch; decreased palpable tightness   Neuromuscular re-ed: Postural correction in sitting and standing  Modalities: Moist heat x 10 min  Has TENS unit at home which has not helped  Self Care: Use of noodle     [facility] Adult PT Treatment:                                                DATE: 10/08/22 Therapeutic Exercise: Standing Chin tuck with noodle 5 sec x 5 Scap squeeze with noodle 5 sec x 5 L's with noodle 3 sec x 10  Doorway stretch 3 positions 20-30 sec hold x 2  Sitting  Sitting with noodle along spine to improve posture and alignment Supine  Chin tuck head supported on pillow 5 sec x 5  Manual Therapy: Skilled palpation to assess response to manual work and dry needling  Trigger Point Dry-Needling  Treatment instructions: Expect mild to moderate muscle soreness. S/S of pneumothorax if dry needled over a lung field, and to seek immediate medical attention should they occur. Patient verbalized understanding of these instructions and education.  Patient Consent Given: Yes Education handout provided: Yes Muscles treated: L scaleni  Electrical stimulation performed: No Parameters: N/A Treatment response/outcome: twitch; decreased palpable tightness   Neuromuscular re-ed: Postural correction in sitting and standing  Modalities: Moist heat x 10 min  Has TENS unit at home which has not helped  Self Care: Use of noodle    PATIENT EDUCATION:  Education details: POC; HEP Person educated: Patient Education method: Explanation, Demonstration, Tactile cues, Verbal cues,  and Handouts Education comprehension: verbalized understanding, returned  demonstration, verbal cues required, tactile cues required, and needs further education  HOME EXERCISE PROGRAM: Access Code: [email or web address] URL: [email or web address] Date: 10/14/2022 Prepared by: Corlis Leak  Exercises - Seated Cervical Retraction  - 2 x daily - 7 x weekly - 1-2 sets - 5-10 reps - 10 sec  hold - Supine Cervical Retraction with Towel  - 2 x daily - 7 x weekly - 1 sets - 5-10 reps - 10 sec  hold - Seated Scapular Retraction  - 2 x daily - 7 x weekly - 1-2 sets - 10 reps - 10 sec  hold - Shoulder External Rotation and Scapular Retraction  - 3 x daily - 7 x weekly - 1 sets - 10 reps - 3-5 sec   hold - Doorway Pec Stretch at 60 Degrees Abduction  - 3 x daily - 7 x weekly - 1 sets - 3 reps - Doorway Pec Stretch at 90 Degrees Abduction  - 3 x daily - 7 x weekly - 1 sets - 3 reps - 30 seconds  hold - Doorway Pec Stretch at 120 Degrees Abduction  - 3 x daily - 7 x weekly - 1 sets - 3 reps - 30 second hold  hold - Shoulder External Rotation and Scapular Retraction with Resistance  - 2 x daily - 7 x weekly - 1 sets - 10 reps - 3-5 sec  hold - Shoulder W - External Rotation with Resistance  - 2 x daily - 7 x weekly - 1-2 sets - 10 reps - 3 sec  hold  Patient Education - Trigger Point Dry Needling  ASSESSMENT:  CLINICAL IMPRESSION: Patient reports good relief of symptoms following initial treatment including the dry needling. Symptoms gradually returned. She has significant muscular tightness to palpation through the L cervical musculature. Continued with exercise; postural correction, manual work, dry needling.    Evaluation: Patient is a 57 y.o. female who was seen today for physical therapy evaluation and treatment for cervical radiculopathy. Onset of pain was related to looking up painting the ceiling of her house. Patient has received several interventions for cervical pain including 3 ESI's, nerve block, meds with no improvement. She presents clinically with poor posture  and alignment; limited cervical and thoracic mobility/ROM; decreased postural strength; pain with functional activities; pain on a daily basis. Patient will benefit from PT to address problems identified. Excellent response to initial treatment including dry needling.   OBJECTIVE IMPAIRMENTS: decreased activity tolerance, decreased mobility, decreased ROM, decreased strength, hypomobility, increased fascial restrictions, increased muscle spasms, impaired flexibility, impaired UE functional use, improper body mechanics, postural dysfunction, and pain.   GOALS: Goals reviewed with patient? Yes  SHORT TERM GOALS: Target date: 11/19/2022   Independent in initial HEP  Baseline:  Goal status: INITIAL  2.  Improve posture and alignment with patient to demonstrate improved upright posture Baseline:  Goal status: INITIAL   LONG TERM GOALS: Target date: 12/31/2022   Decrease pain in L cervical spine by 75-100% allowing patient to return to all normal functional activities  Baseline:  Goal status: INITIAL  2.  Increase AROM cervical spine to WNL's and pain free  Baseline:  Goal status: INITIAL  3.  5/5 strength L shoulder and posterior shoulder girdle musculature  Baseline:  Goal status: INITIAL  4.  Improve posterior shoulder girdle strength with patient to demonstrate improved upright posture with posterior shoulder girdle engaged  Baseline:  Goal status: INITIAL  5.  Independent  in HEP including aquatic therapy program as indicated  Baseline:  Goal status: INITIAL  6.  Improve functional limitation score to 45 Baseline:  Goal status: INITIAL   PLAN:  PT FREQUENCY: 2x/week  PT DURATION: 12 weeks  PLANNED INTERVENTIONS: Therapeutic exercises, Therapeutic activity, Neuromuscular re-education, Patient/Family education, Self Care, Joint mobilization, Aquatic Therapy, Dry Needling, Electrical stimulation, Spinal mobilization, Cryotherapy, Moist heat, Taping, Traction,  Ultrasound, Ionotophoresis 4mg /ml Dexamethasone, Manual therapy, and Re-evaluation  PLAN FOR NEXT SESSION: review and progress exercises; continue with spine care education and postural correction, manual work,DN,modalities as indicate     P , PT 10/14/2022, 8:01 AM

## 2022-10-19 ENCOUNTER — Ambulatory Visit: Payer: Medicare HMO | Admitting: Rehabilitative and Restorative Service Providers"

## 2022-10-19 ENCOUNTER — Encounter: Payer: Self-pay | Admitting: Rehabilitative and Restorative Service Providers"

## 2022-10-19 DIAGNOSIS — M5412 Radiculopathy, cervical region: Secondary | ICD-10-CM

## 2022-10-19 DIAGNOSIS — M6281 Muscle weakness (generalized): Secondary | ICD-10-CM

## 2022-10-19 DIAGNOSIS — R29898 Other symptoms and signs involving the musculoskeletal system: Secondary | ICD-10-CM

## 2022-10-19 DIAGNOSIS — M542 Cervicalgia: Secondary | ICD-10-CM

## 2022-10-19 DIAGNOSIS — R293 Abnormal posture: Secondary | ICD-10-CM

## 2022-10-19 NOTE — Therapy (Signed)
OUTPATIENT PHYSICAL THERAPY CERVICAL EVALUATION   Patient Name: Yolanda Jordan MRN: [id number] DOB:1966-02-11, 57 y.o., female Today's Date: 10/19/2022  END OF SESSION:  PT End of Session - 10/19/22 0939     Visit Number 3    Number of Visits 24    Date for PT Re-Evaluation 12/31/22    Authorization Type not available    PT Start Time 0930    PT Stop Time 1020    PT Time Calculation (min) 50 min    Activity Tolerance Patient tolerated treatment well             Past Medical History:  Diagnosis Date   Anxiety    Back pain    Cancer (HCC) 2000   thyroid cancer   Chronic kidney disease, stage 3 (HCC)    Constipation    Depression    Drug use    Dyspnea    panic attacks, high humidity   GERD (gastroesophageal reflux disease)    High blood pressure    No HTN as of 2024   Hx of thyroid cancer    Hypothyroidism    Joint pain    Memory loss    Migraines    PONV (postoperative nausea and vomiting)    Sleep apnea    Hx. Negative study 2023   SOB (shortness of breath)    Thyroid disease    Vitamin D deficiency    Past Surgical History:  Procedure Laterality Date   APPENDECTOMY     BLADDER SUSPENSION N/A 01/03/2016   Procedure: TRANSVAGINAL TAPE (TVT) PROCEDURE;  Surgeon: Lavina Hamman, MD;  Location: [facility];  Service: Gynecology;  Laterality: N/A;   CESAREAN SECTION     x2   CYSTOSCOPY N/A 01/03/2016   Procedure: CYSTOSCOPY;  Surgeon: Lavina Hamman, MD;  Location: [facility];  Service: Gynecology;  Laterality: N/A;   HEMORROIDECTOMY     age 61   LASIK Bilateral    PERINEOPLASTY N/A 01/03/2016   Procedure: PERINEOPLASTY;  Surgeon: Lavina Hamman, MD;  Location: [facility];  Service: Gynecology;  Laterality: N/A;   THYROIDECTOMY  2000   TUBAL LIGATION     uterine ablation     05/2010   WISDOM TOOTH EXTRACTION     Patient Active Problem List   Diagnosis Date Noted   Insulin resistance 07/18/2019   Class 2 severe obesity with serious comorbidity and body mass  index (BMI) of 37.0 to 37.9 in adult (HCC) 06/22/2019   Stage 3a chronic kidney disease (HCC) 05/09/2019   Essential hypertension 01/12/2018   Myopia with astigmatism and presbyopia, bilateral 07/03/2016   Family history of colonic polyps 06/30/2016   Gastroesophageal reflux disease without esophagitis 06/30/2016   Juvenile polyposis syndrome 06/30/2016   Prediabetes 09/24/2015   OSA (obstructive sleep apnea) 08/06/2015   Vitamin D deficiency 03/13/2015   H/O radioactive iodine thyroid ablation 12/03/2013   Migraine with aura and without status migrainosus, not intractable 05/10/2013   Bipolar 1 disorder, depressed (HCC) 11/04/2012   Hypothyroidism 11/04/2012   History of thyroid cancer 07/28/2011    PCP:  Dr Barnabas Lister  REFERRING PROVIDER: Clovis Riley, PA-C  REFERRING DIAG: Cervical radiculopathy   THERAPY DIAG:  Radiculopathy, cervical region  Cervicalgia  Other symptoms and signs involving the musculoskeletal system  Abnormal posture  Muscle weakness (generalized)  Rationale for Evaluation and Treatment: Rehabilitation  ONSET DATE: 11/30/21  SUBJECTIVE:  SUBJECTIVE STATEMENT: Patient reports some soreness following dry needling at last visit. She has worked on her exercises at home. Not sure about the dry needling.   Eval: Patient reports that she was scheduled for therapy last week but cancelled and wants to try PT. Patient reports that she was painting ceilings in her house 10/23 and had significant pain in the neck which has persisted since that time with no significant change. Symptoms are better in the am and worse as she is moving around. Meds do not help. She has had epidural injections and nerve block in cervical spine with no change in symptoms.  Patient  reports significant improvement with dry needling. She is pain free at the end of treatment.    Hand dominance: Right  PERTINENT HISTORY:  Thyroid cancer; thyroid surgery 2000; heart burn; headaches; short term memory loss following childbirth - ECT treatment; retired on disability due to memory loss   PAIN:  Are you having pain? Yes: NPRS scale: 2/10; up to 5/10 Pain location: L neck area  Pain description: constant sharp  Aggravating factors: looking up; moving; bending; lifting Relieving factors: nothing   PRECAUTIONS: None   WEIGHT BEARING RESTRICTIONS: No  FALLS:  Has patient fallen in last 6 months? 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 C5-C6 mild spinal canal stenosis, with moderate to severe left and mild right neural foraminal narrowing. 2. C6-C7 mild spinal canal stenosis and mild right neural foraminal narrowing. 3. C4-C5 mild left neural foraminal narrowing.  CT head 09/26/22: 1. No acute intracranial findings. 2. Right-sided deviation and spurring of the nasal septum   PATIENT SURVEYS:  FOTO 29; goal 45  SENSATION: WFL  POSTURE: Patient presents with head forward posture with increased thoracic kyphosis; shoulders rounded and elevated; scapulae abducted and rotated along the thoracic spine; head of the humerus anterior in orientation.   PALPATION: Muscular tightness in L > R ant/lat/posterior cervical musculature; pecs; upper trap; leveator; teres; upper thoracic paraspinals. Tightness and discomfort in the mid to upper thoracic and loser cervical spine with PA and lateral mobs    CERVICAL ROM:   Active ROM A/PROM (deg) eval  Flexion 44  Extension 20  pain L neck    Right lateral flexion 20  Left lateral flexion 17 pain L neck  Right rotation 43  Left rotation 26 pain L neck   (Blank rows = not tested)  UPPER EXTREMITY ROM: end range tightness elevation   Active ROM Right eval Left eval  Shoulder flexion [facility]  Shoulder extension    Shoulder abduction [facility]  Shoulder adduction    Shoulder extension    Shoulder internal rotation    Shoulder external rotation    Elbow flexion    Elbow extension    Wrist flexion    Wrist extension    Wrist ulnar deviation    Wrist radial deviation    Wrist pronation    Wrist supination     (Blank rows = not tested)  UPPER EXTREMITY MMT: appears UE weakness is due to pain with resistive testing L shoulder flexion; abduction   MMT Right eval Left eval  Shoulder flexion 5  5-  Shoulder extension 5 5  Shoulder abduction 5 4  Shoulder adduction    Shoulder extension    Shoulder internal rotation    Shoulder external rotation    Middle trapezius 4 4  Lower trapezius 4 4  Elbow flexion    Elbow extension    Wrist flexion    Wrist extension    Wrist ulnar deviation    Wrist radial deviation    Wrist pronation    Wrist supination    Grip strength     (Blank rows = not tested)   [facility] Adult PT Treatment:                                                DATE: 10/19/22 Therapeutic Exercise: Standing Chin tuck with noodle 5 sec x 5 Scap squeeze with noodle 5 sec x 5 L's with noodle 3 sec x 10  L" with noodle yellow tb 3 sec x 10  W's with noodle yellow TB 3 sec x 10  Posterior shoulder rolls x 20  Doorway stretch 3 positions 30 sec hold x 2  Row blue TB 3 sec x 10  Shoudler extension blue TB 3 sec x 10  Sitting  Thoracic extension with coregeous ball  Sitting with noodle along spine to improve posture and alignment Supine  Chin tuck head supported on pillow 5 sec x 5  Manual Therapy: Skilled palpation to assess response to manual work and dry needling  Soft tissue  mobilization with deep tissue work through the L scaleni and lateral cervical musculature  Trigger Point Dry-Needling  Treatment instructions: Expect mild to moderate muscle soreness. S/S of pneumothorax if dry needled over a lung field, and to seek immediate medical attention should they occur. Patient verbalized understanding of these instructions and education.  Patient Consent Given: Yes Education handout provided: Yes Muscles treated: L scaleni  Electrical stimulation performed: No Parameters: N/A Treatment response/outcome: twitch; decreased palpable tightness   Neuromuscular re-ed: Postural correction in sitting and standing  Modalities: Moist heat x 10 min  Has TENS unit at home - has not used   Self Care: Use of noodle   [facility] Adult PT Treatment:                                                DATE: 10/08/22 Therapeutic Exercise: Standing Chin tuck with noodle 5 sec x 5 Scap squeeze with noodle 5 sec x 5 L's with noodle 3 sec x 10  Doorway stretch 3 positions 20-30 sec hold x 2  Sitting  Sitting with noodle along spine to improve posture and alignment Supine  Chin tuck head supported on pillow 5 sec x 5  Manual Therapy: Skilled palpation to assess response to manual work and dry needling  Trigger Point Dry-Needling  Treatment instructions: Expect mild to moderate muscle soreness. S/S of pneumothorax if dry needled over a lung field, and to seek immediate medical attention should they occur. Patient verbalized understanding of these instructions and education.  Patient Consent Given: Yes Education handout provided: Yes Muscles treated: L scaleni  Electrical stimulation performed: No Parameters: N/A Treatment response/outcome: twitch; decreased palpable tightness   Neuromuscular re-ed: Postural correction in sitting and standing  Modalities: Moist heat x 10 min  Has TENS unit at home which has not helped  Self Care: Use of noodle     PATIENT EDUCATION:   Education details: POC; HEP Person educated: Patient Education method: Explanation, Demonstration, Tactile cues, Verbal cues, and Handouts Education comprehension: verbalized understanding, returned demonstration, verbal cues required, tactile cues required, and needs further education  HOME EXERCISE PROGRAM: Access Code: [email or web address] URL: [email or web address] Date: 10/19/2022 Prepared by: Corlis Leak  Exercises - Seated Cervical Retraction  - 2 x daily - 7 x weekly - 1-2 sets - 5-10 reps - 10 sec  hold - Supine Cervical Retraction with Towel  - 2 x daily - 7 x weekly - 1 sets - 5-10 reps - 10 sec  hold - Seated Scapular Retraction  - 2 x daily - 7 x weekly - 1-2 sets - 10 reps - 10 sec  hold - Shoulder External Rotation and Scapular Retraction  - 3 x daily - 7 x weekly - 1 sets - 10 reps - 3-5 sec   hold - Doorway Pec Stretch at 60 Degrees Abduction  - 3 x daily - 7 x weekly - 1 sets - 3 reps - Doorway Pec Stretch at 90 Degrees Abduction  - 3 x daily - 7 x weekly - 1 sets - 3 reps - 30 seconds  hold - Doorway Pec Stretch at 120 Degrees Abduction  - 3 x daily - 7 x weekly - 1 sets - 3 reps - 30 second hold  hold - Shoulder External Rotation and Scapular Retraction with Resistance  - 2 x daily - 7 x weekly - 1 sets - 10 reps - 3-5 sec  hold - Shoulder W - External Rotation with Resistance  - 2 x daily - 7 x weekly - 1-2 sets - 10 reps - 3 sec  hold - Standing Bilateral Low Shoulder Row with Anchored Resistance  - 2 x daily - 7 x weekly - 1-3 sets - 10 reps - 2-3 sec  hold - Shoulder extension with resistance - Neutral  - 1 x daily - 7 x weekly - 1-2 sets - 10 reps - 3-5 sec  hold - Supine Chest Stretch on Foam Roll  - 2 x daily - 7 x weekly - 1 sets - 1 reps - 2-5 min  sec  hold  Patient Education - Trigger Point Dry Needling  ASSESSMENT:  CLINICAL IMPRESSION: Patient reports continued improvement in symptoms.  Note continued palpable tightness L C3/4/5 facet area and  musculature of L lateral cervical spine. Progressed with postural strengthening, adding posterior shoulder girdle strengthening. Tolerated a few needles with dry needling. She has significant muscular tightness to palpation through the L cervical musculature. Continued with exercise; postural correction, manual work, dry needling.    Evaluation: Patient is a 57 y.o. female who was seen today for physical therapy evaluation and treatment for cervical radiculopathy. Onset of pain was related to looking up painting the ceiling of her house. Patient has received several interventions for cervical pain including 3 ESI's, nerve block, meds with no improvement. She presents clinically with poor posture and alignment; limited cervical and thoracic mobility/ROM; decreased postural strength; pain with functional activities; pain on a daily basis. Patient will benefit from PT to address problems identified. Excellent response to initial treatment including dry needling.   OBJECTIVE IMPAIRMENTS: decreased activity tolerance, decreased mobility, decreased ROM, decreased strength, hypomobility, increased fascial restrictions, increased muscle spasms, impaired flexibility, impaired UE functional use, improper  body mechanics, postural dysfunction, and pain.   GOALS: Goals reviewed with patient? Yes  SHORT TERM GOALS: Target date: 11/19/2022   Independent in initial HEP  Baseline:  Goal status: INITIAL  2.  Improve posture and alignment with patient to demonstrate improved upright posture Baseline:  Goal status: INITIAL   LONG TERM GOALS: Target date: 12/31/2022   Decrease pain in L cervical spine by 75-100% allowing patient to return to all normal functional activities  Baseline:  Goal status: INITIAL  2.  Increase AROM cervical spine to WNL's and pain free  Baseline:  Goal status: INITIAL  3.  5/5 strength L shoulder and posterior shoulder girdle musculature  Baseline:  Goal status: INITIAL  4.   Improve posterior shoulder girdle strength with patient to demonstrate improved upright posture with posterior shoulder girdle engaged  Baseline:  Goal status: INITIAL  5.  Independent in HEP including aquatic therapy program as indicated  Baseline:  Goal status: INITIAL  6.  Improve functional limitation score to 45 Baseline:  Goal status: INITIAL   PLAN:  PT FREQUENCY: 2x/week  PT DURATION: 12 weeks  PLANNED INTERVENTIONS: Therapeutic exercises, Therapeutic activity, Neuromuscular re-education, Patient/Family education, Self Care, Joint mobilization, Aquatic Therapy, Dry Needling, Electrical stimulation, Spinal mobilization, Cryotherapy, Moist heat, Taping, Traction, Ultrasound, Ionotophoresis 4mg /ml Dexamethasone, Manual therapy, and Re-evaluation  PLAN FOR NEXT SESSION: review and progress exercises; continue with spine care education and postural correction, manual work,DN,modalities as indicate    Addie Alonge Rober Minion, PT 10/19/2022, 9:39 AM

## 2022-10-21 ENCOUNTER — Ambulatory Visit: Payer: Medicare HMO | Admitting: Rehabilitative and Restorative Service Providers"

## 2022-10-21 ENCOUNTER — Encounter: Payer: Self-pay | Admitting: Rehabilitative and Restorative Service Providers"

## 2022-10-21 DIAGNOSIS — M542 Cervicalgia: Secondary | ICD-10-CM

## 2022-10-21 DIAGNOSIS — R293 Abnormal posture: Secondary | ICD-10-CM

## 2022-10-21 DIAGNOSIS — R29898 Other symptoms and signs involving the musculoskeletal system: Secondary | ICD-10-CM

## 2022-10-21 DIAGNOSIS — M6281 Muscle weakness (generalized): Secondary | ICD-10-CM

## 2022-10-21 DIAGNOSIS — M5412 Radiculopathy, cervical region: Secondary | ICD-10-CM

## 2022-10-21 NOTE — Therapy (Addendum)
 OUTPATIENT PHYSICAL THERAPY CERVICAL EVALUATION PHYSICAL THERAPY DISCHARGE SUMMARY  Visits from Start of Care: 4  Current functional level related to goals / functional outcomes: See progress note for discharge status    Remaining deficits: Continued irritation of elbow    Education / Equipment: HEP    Patient agrees to discharge. Patient goals were partially met. Patient is being discharged due to not returning since the last visit.  Ndidi Nesby P. Leonor Liv PT, MPH 05/11/23 12:20 PM     Patient Name: Ainslee Sou MRN: [id number] DOB:Sep 20, 1965, 57 y.o., female Today's Date: 10/21/2022  END OF SESSION:  PT End of Session - 10/21/22 1016     Visit Number 4    Number of Visits 24    Date for PT Re-Evaluation 12/31/22    Authorization Type [employer] 12 visits approved $25 copay    Authorization - Visit Number 4    Authorization - Number of Visits 12    PT Start Time 1015    PT Stop Time 1104    PT Time Calculation (min) 49 min    Activity Tolerance Patient tolerated treatment well             Past Medical History:  Diagnosis Date   Anxiety    Back pain    Cancer (HCC) 2000   thyroid cancer   Chronic kidney disease, stage 3 (HCC)    Constipation    Depression    Drug use    Dyspnea    panic attacks, high humidity   GERD (gastroesophageal reflux disease)    High blood pressure    No HTN as of 2024   Hx of thyroid cancer    Hypothyroidism    Joint pain    Memory loss    Migraines    PONV (postoperative nausea and vomiting)    Sleep apnea    Hx. Negative study 2023   SOB (shortness of breath)    Thyroid disease    Vitamin D deficiency    Past Surgical History:  Procedure Laterality Date   APPENDECTOMY     BLADDER SUSPENSION N/A 01/03/2016   Procedure: TRANSVAGINAL TAPE (TVT) PROCEDURE;  Surgeon: Lavina Hamman, MD;  Location: [facility];  Service: Gynecology;  Laterality: N/A;   CESAREAN SECTION     x2   CYSTOSCOPY N/A 01/03/2016   Procedure: CYSTOSCOPY;   Surgeon: Lavina Hamman, MD;  Location: [facility];  Service: Gynecology;  Laterality: N/A;   HEMORROIDECTOMY     age 89   LASIK Bilateral    PERINEOPLASTY N/A 01/03/2016   Procedure: PERINEOPLASTY;  Surgeon: Lavina Hamman, MD;  Location: [facility];  Service: Gynecology;  Laterality: N/A;   THYROIDECTOMY  2000   TUBAL LIGATION     uterine ablation     05/2010   WISDOM TOOTH EXTRACTION     Patient Active Problem List   Diagnosis Date Noted   Insulin resistance 07/18/2019   Class 2 severe obesity with serious comorbidity and body mass index (BMI) of 37.0 to 37.9 in adult [facility]) 06/22/2019   Stage 3a chronic kidney disease (HCC) 05/09/2019   Essential hypertension 01/12/2018   Myopia with astigmatism and presbyopia, bilateral 07/03/2016   Family history of colonic polyps 06/30/2016   Gastroesophageal reflux disease without esophagitis 06/30/2016   Juvenile polyposis syndrome 06/30/2016   Prediabetes 09/24/2015   OSA (obstructive sleep apnea) 08/06/2015   Vitamin D deficiency 03/13/2015   H/O radioactive iodine thyroid ablation 12/03/2013   Migraine with aura and without  status migrainosus, not intractable 05/10/2013   Bipolar 1 disorder, depressed (HCC) 11/04/2012   Hypothyroidism 11/04/2012   History of thyroid cancer 07/28/2011    PCP:  Dr Barnabas Lister  REFERRING PROVIDER: Clovis Riley, PA-C  REFERRING DIAG: Cervical radiculopathy   THERAPY DIAG:  Radiculopathy, cervical region  Cervicalgia  Other symptoms and signs involving the musculoskeletal system  Abnormal posture  Muscle weakness (generalized)  Rationale for Evaluation and Treatment: Rehabilitation  ONSET DATE: 11/30/21  SUBJECTIVE:                                                                                                                                                                                                         SUBJECTIVE STATEMENT: Patient reports flare up os tennis elbow symptoms. Has  to modify exercises. The TB exercises irritated that L elbow. Neurologist put her on Topamax for headaches this week. The medication makes he groggy and sleepy feeling but she did not wake up with a headache. Did well after treatment with some soreness following manual work and dry needling.   Eval: Patient reports that she was scheduled for therapy last week but cancelled and wants to try PT. Patient reports that she was painting ceilings in her house 10/23 and had significant pain in the neck which has persisted since that time with no significant change. Symptoms are better in the am and worse as she is moving around. Meds do not help. She has had epidural injections and nerve block in cervical spine with no change in symptoms.  Patient reports significant improvement with dry needling. She is pain free at the end of treatment.    Hand dominance: Right  PERTINENT HISTORY:  Thyroid cancer; thyroid surgery 2000; heart burn; headaches; short term memory loss following childbirth - ECT treatment; retired on disability due to memory loss   PAIN:  Are you having pain? Yes: NPRS scale: 3/10; up to 5/10 Pain location: L neck area  Pain description: constant sharp  Aggravating factors: looking up; moving; bending; lifting Relieving factors: nothing   PRECAUTIONS: None   WEIGHT BEARING RESTRICTIONS: No  FALLS:  Has patient fallen in last 6 months? 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 C5-C6 mild spinal  canal stenosis, with moderate to severe left and mild right neural foraminal narrowing. 2. C6-C7 mild spinal canal stenosis and mild right neural foraminal narrowing. 3. C4-C5 mild left  neural foraminal narrowing.  CT head 09/26/22: 1. No acute intracranial findings. 2. Right-sided deviation and spurring of the nasal septum   PATIENT SURVEYS:  FOTO 29; goal 45  SENSATION: WFL  POSTURE: Patient presents with head forward posture with increased thoracic kyphosis; shoulders rounded and elevated; scapulae abducted and rotated along the thoracic spine; head of the humerus anterior in orientation.   PALPATION: Muscular tightness in L > R ant/lat/posterior cervical musculature; pecs; upper trap; leveator; teres; upper thoracic paraspinals. Tightness and discomfort in the mid to upper thoracic and loser cervical spine with PA and lateral mobs    CERVICAL ROM:   Active ROM A/PROM (deg) eval  Flexion 44  Extension 20 pain L neck    Right lateral flexion 20  Left lateral flexion 17 pain L neck  Right rotation 43  Left rotation 26 pain L neck   (Blank rows = not tested)  UPPER EXTREMITY ROM: end range tightness elevation   Active ROM Right eval Left eval  Shoulder flexion [facility]  Shoulder extension    Shoulder abduction [facility]  Shoulder adduction    Shoulder extension    Shoulder internal rotation    Shoulder external rotation    Elbow flexion    Elbow extension    Wrist flexion    Wrist extension    Wrist ulnar deviation    Wrist radial deviation    Wrist pronation    Wrist supination     (Blank rows = not tested)  UPPER EXTREMITY MMT: appears UE weakness is due to pain with resistive testing L shoulder flexion; abduction   MMT Right eval Left eval  Shoulder flexion 5 5-  Shoulder extension 5 5  Shoulder abduction 5 4  Shoulder adduction    Shoulder extension    Shoulder internal rotation    Shoulder external rotation    Middle trapezius 4 4  Lower trapezius 4 4  Elbow flexion    Elbow extension    Wrist flexion    Wrist extension    Wrist ulnar deviation    Wrist radial deviation    Wrist pronation    Wrist supination    Grip strength      (Blank rows = not tested)   [facility] Adult PT Treatment:                                                DATE: 10/21/22 Therapeutic Exercise: Standing Chin tuck with noodle 5 sec x 5 Scap squeeze with noodle 10 sec x 10 L's with noodle 3 sec x 10  Posterior shoulder rolls x 20  Sitting  Thoracic extension with coregeous ball T spine to encourage thoracic extension Sitting with noodle along spine to improve posture and alignment Forearm extensor stretch 10-20 sec x 4  Supine  Chin tuck head supported on pillow 10 sec x 5  Nodding yes x 10 Manual Therapy: Skilled palpation to assess response to manual work   Soft tissue mobilization with deep tissue work through the L > R scaleni and lateral cervical musculature  PROM/stretching into cervical flexion and flexion with slight rotation  STM L extensor forearm with transverse friction massage at  area of banding and tightness in the lateral epicondyle   Neuromuscular re-ed: Postural correction in sitting and standing  Modalities: Moist heat x 10 min  Has TENS unit at home - has not used   Self Care: Use of noodle   [facility] Adult PT Treatment:                                                DATE: 10/19/22 Therapeutic Exercise: Standing Chin tuck with noodle 5 sec x 5 Scap squeeze with noodle 5 sec x 5 L's with noodle 3 sec x 10  L" with noodle yellow tb 3 sec x 10  W's with noodle yellow TB 3 sec x 10  Posterior shoulder rolls x 20  Doorway stretch 3 positions 30 sec hold x 2  Row blue TB 3 sec x 10  Shoudler extension blue TB 3 sec x 10  Sitting  Thoracic extension with coregeous ball  Sitting with noodle along spine to improve posture and alignment Supine  Chin tuck head supported on pillow 5 sec x 5  Manual Therapy: Skilled palpation to assess response to manual work and dry needling  Soft tissue mobilization with deep tissue work through the L scaleni and lateral cervical musculature  Trigger Point Dry-Needling  Treatment  instructions: Expect mild to moderate muscle soreness. S/S of pneumothorax if dry needled over a lung field, and to seek immediate medical attention should they occur. Patient verbalized understanding of these instructions and education.  Patient Consent Given: Yes Education handout provided: Yes Muscles treated: L scaleni  Electrical stimulation performed: No Parameters: N/A Treatment response/outcome: twitch; decreased palpable tightness   Neuromuscular re-ed: Postural correction in sitting and standing  Modalities: Has TENS unit at home - has not used   Self Care: Use of noodle   [facility] Adult PT Treatment:                                                DATE: 10/08/22 Therapeutic Exercise: Standing Chin tuck with noodle 5 sec x 5 Scap squeeze with noodle 5 sec x 5 L's with noodle 3 sec x 10  Doorway stretch 3 positions 20-30 sec hold x 2  Sitting  Sitting with noodle along spine to improve posture and alignment Supine  Chin tuck head supported on pillow 5 sec x 5  Manual Therapy: Skilled palpation to assess response to manual work and dry needling  Trigger Point Dry-Needling  Treatment instructions: Expect mild to moderate muscle soreness. S/S of pneumothorax if dry needled over a lung field, and to seek immediate medical attention should they occur. Patient verbalized understanding of these instructions and education.  Patient Consent Given: Yes Education handout provided: Yes Muscles treated: L scaleni  Electrical stimulation performed: No Parameters: N/A Treatment response/outcome: twitch; decreased palpable tightness   Neuromuscular re-ed: Postural correction in sitting and standing  Modalities: Moist heat x 10 min  Has TENS unit at home which has not helped  Self Care: Use of noodle     PATIENT EDUCATION:  Education details: POC; HEP Person educated: Patient Education method: Explanation, Demonstration, Tactile cues, Verbal cues, and Handouts Education  comprehension: verbalized understanding, returned demonstration, verbal cues required, tactile cues required, and  needs further education  HOME EXERCISE PROGRAM: Access Code: [email or web address] URL: [email or web address] Date: 10/19/2022 Prepared by: Corlis Leak  Exercises - Seated Cervical Retraction  - 2 x daily - 7 x weekly - 1-2 sets - 5-10 reps - 10 sec  hold - Supine Cervical Retraction with Towel  - 2 x daily - 7 x weekly - 1 sets - 5-10 reps - 10 sec  hold - Seated Scapular Retraction  - 2 x daily - 7 x weekly - 1-2 sets - 10 reps - 10 sec  hold - Shoulder External Rotation and Scapular Retraction  - 3 x daily - 7 x weekly - 1 sets - 10 reps - 3-5 sec   hold - Doorway Pec Stretch at 60 Degrees Abduction  - 3 x daily - 7 x weekly - 1 sets - 3 reps - Doorway Pec Stretch at 90 Degrees Abduction  - 3 x daily - 7 x weekly - 1 sets - 3 reps - 30 seconds  hold - Doorway Pec Stretch at 120 Degrees Abduction  - 3 x daily - 7 x weekly - 1 sets - 3 reps - 30 second hold  hold - Shoulder External Rotation and Scapular Retraction with Resistance  - 2 x daily - 7 x weekly - 1 sets - 10 reps - 3-5 sec  hold - Shoulder W - External Rotation with Resistance  - 2 x daily - 7 x weekly - 1-2 sets - 10 reps - 3 sec  hold - Standing Bilateral Low Shoulder Row with Anchored Resistance  - 2 x daily - 7 x weekly - 1-3 sets - 10 reps - 2-3 sec  hold - Shoulder extension with resistance - Neutral  - 1 x daily - 7 x weekly - 1-2 sets - 10 reps - 3-5 sec  hold - Supine Chest Stretch on Foam Roll  - 2 x daily - 7 x weekly - 1 sets - 1 reps - 2-5 min  sec  hold  Patient Education - Trigger Point Dry Needling  ASSESSMENT:  CLINICAL IMPRESSION: Patient reports irritation of L lateral epicondyle - tennis elbow which she has had in the past. Patient has also started Topamax for headaches and states that she feel "awful" today. She does not have a headache today but does have continued pain in the L cervical  area. Note continued palpable tightness L C3/4/5 facet area and musculature of L lateral cervical spine. Modified postural strengthening, avoiding gripping for TB. She has significant muscular tightness to palpation through the L cervical musculature. Continued with exercise; postural correction, manual work, dry needling as tolerated.    Evaluation: Patient is a 57 y.o. female who was seen today for physical therapy evaluation and treatment for cervical radiculopathy. Onset of pain was related to looking up painting the ceiling of her house. Patient has received several interventions for cervical pain including 3 ESI's, nerve block, meds with no improvement. She presents clinically with poor posture and alignment; limited cervical and thoracic mobility/ROM; decreased postural strength; pain with functional activities; pain on a daily basis. Patient will benefit from PT to address problems identified. Excellent response to initial treatment including dry needling.   OBJECTIVE IMPAIRMENTS: decreased activity tolerance, decreased mobility, decreased ROM, decreased strength, hypomobility, increased fascial restrictions, increased muscle spasms, impaired flexibility, impaired UE functional use, improper body mechanics, postural dysfunction, and pain.   GOALS: Goals reviewed with patient? Yes  SHORT TERM GOALS: Target date: 11/19/2022   Independent in initial  HEP  Baseline:  Goal status: INITIAL  2.  Improve posture and alignment with patient to demonstrate improved upright posture Baseline:  Goal status: INITIAL   LONG TERM GOALS: Target date: 12/31/2022   Decrease pain in L cervical spine by 75-100% allowing patient to return to all normal functional activities  Baseline:  Goal status: INITIAL  2.  Increase AROM cervical spine to WNL's and pain free  Baseline:  Goal status: INITIAL  3.  5/5 strength L shoulder and posterior shoulder girdle musculature  Baseline:  Goal status:  INITIAL  4.  Improve posterior shoulder girdle strength with patient to demonstrate improved upright posture with posterior shoulder girdle engaged  Baseline:  Goal status: INITIAL  5.  Independent in HEP including aquatic therapy program as indicated  Baseline:  Goal status: INITIAL  6.  Improve functional limitation score to 45 Baseline:  Goal status: INITIAL   PLAN:  PT FREQUENCY: 2x/week  PT DURATION: 12 weeks  PLANNED INTERVENTIONS: Therapeutic exercises, Therapeutic activity, Neuromuscular re-education, Patient/Family education, Self Care, Joint mobilization, Aquatic Therapy, Dry Needling, Electrical stimulation, Spinal mobilization, Cryotherapy, Moist heat, Taping, Traction, Ultrasound, Ionotophoresis 4mg /ml Dexamethasone, Manual therapy, and Re-evaluation  PLAN FOR NEXT SESSION: review and progress exercises; continue with spine care education and postural correction, manual work,DN,modalities as indicate    Garnett Nunziata Rober Minion, PT 10/21/2022, 10:17 AM

## 2022-10-26 ENCOUNTER — Ambulatory Visit: Payer: Medicare HMO | Admitting: Rehabilitative and Restorative Service Providers"

## 2022-10-28 ENCOUNTER — Ambulatory Visit: Payer: Medicare HMO | Admitting: Rehabilitative and Restorative Service Providers"

## 2022-11-03 ENCOUNTER — Encounter: Payer: Medicare HMO | Admitting: Rehabilitative and Restorative Service Providers"

## 2022-11-05 ENCOUNTER — Ambulatory Visit: Payer: Medicare HMO | Admitting: Rehabilitative and Restorative Service Providers"

## 2022-11-10 ENCOUNTER — Ambulatory Visit: Payer: Medicare HMO | Admitting: Rehabilitative and Restorative Service Providers"

## 2022-11-12 ENCOUNTER — Ambulatory Visit: Payer: Medicare HMO | Admitting: Rehabilitative and Restorative Service Providers"

## 2022-11-16 ENCOUNTER — Ambulatory Visit: Payer: Medicare HMO | Admitting: Rehabilitative and Restorative Service Providers"

## 2022-11-18 ENCOUNTER — Ambulatory Visit: Payer: Medicare HMO | Admitting: Rehabilitative and Restorative Service Providers"

## 2022-11-23 ENCOUNTER — Ambulatory Visit: Payer: Medicare HMO | Admitting: Rehabilitative and Restorative Service Providers"

## 2022-11-25 ENCOUNTER — Ambulatory Visit: Payer: Medicare HMO | Admitting: Rehabilitative and Restorative Service Providers"

## 2022-12-22 ENCOUNTER — Ambulatory Visit (INDEPENDENT_AMBULATORY_CARE_PROVIDER_SITE_OTHER): Payer: Medicare HMO | Admitting: Psychiatry

## 2022-12-22 ENCOUNTER — Encounter (HOSPITAL_COMMUNITY): Payer: Self-pay | Admitting: Psychiatry

## 2022-12-22 VITALS — BP 120/93 | HR 85 | Ht 69.0 in | Wt 218.0 lb

## 2022-12-22 DIAGNOSIS — F319 Bipolar disorder, unspecified: Secondary | ICD-10-CM

## 2022-12-22 DIAGNOSIS — F411 Generalized anxiety disorder: Secondary | ICD-10-CM | POA: Diagnosis not present

## 2022-12-22 DIAGNOSIS — F4310 Post-traumatic stress disorder, unspecified: Secondary | ICD-10-CM

## 2022-12-22 MED ORDER — TRAZODONE HCL 50 MG PO TABS: 50.0000 mg | ORAL_TABLET | Freq: Every day | ORAL | 0 refills | Status: DC

## 2022-12-22 MED ORDER — BUPROPION HCL ER (XL) 300 MG PO TB24: 300.0000 mg | ORAL_TABLET | Freq: Every day | ORAL | 0 refills | Status: DC
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
## 2023-03-12 NOTE — Progress Notes (Signed)
 [facility]  - [facility]  [address] [place] [address]                                      03/13/23 Patient name: Yolanda Jordan  Date of birth: [birth year]   SUBJECTIVE:  Chief Complaint  Patient presents with  . Hyperlipidemia    Pt presents for 6 mth follow up on hdl, headache, anxiety, etc.  Pt is not fasting today.  Labs were done a few days ago.  Pt declines flu shot today.   SABRA Headache  . Anxiety    Yolanda Jordan is a 58 y.o. female comes in for follow-up hypertension, hyperlipidemia, anxiety, headache.  Patient reports her headache is better now.  Recently, she has headache about once every 2 to 3 weeks.  She is using Maxalt as needed.  Patient reports fatigue and weight gain.  She is taking levothyroxine  112 mcg daily.  She follows with endocrinologist Dr. Braulio.  For anxiety, patient is taking Xanax 0.2 mg 2 times daily, morning and at 5 PM.  Patient has tried to wean it off, but was agitated. She follows with behavioral health for bipolar and anxiety.  She denies any fever, chills, dizziness, headache, blurred vision. No SOB, chest pain, palpitation. No nausea, vomiting, or abdominal pain.  BP Readings from Last 3 Encounters:  03/12/23 123/85  03/11/23 125/86  11/24/22 102/72    Lab Results  Component Value Date   HGBA1C 5.5 05/02/2020   HGBA1C 5.5 05/02/2020   HGBA1C 5.3 12/29/2019    Lab Results  Component Value Date   LDLCALC 71 03/09/2023   LDLCALC 62 09/12/2022    Lab Results  Component Value Date   CHOL 139 03/09/2023   HDL 53 (L) 03/09/2023   LDLCALC 71 03/09/2023   TRIG 71 03/09/2023      The 10-year ASCVD risk score (Arnett DK, et al., 2019) is: 1.9%   Values used to calculate the score:     Age: 58 years     Sex: Female     Is Non-Hispanic African American: No     Diabetic: No     Tobacco smoker: No     Systolic Blood Pressure: 123 mmHg     Is BP treated:  No     HDL Cholesterol: 53 mg/dL     Total Cholesterol: 139 mg/dL  HISTORY: Ihave reviewed the allergies, current medications, past medical and surgical history, family and social history, problem list, and updated as needed.  Allergies Allergies  Allergen Reactions  . Opioids - Morphine Analogues Itching  . Amitriptyline Other (See Comments)    Weight gain  . Buspirone Other (See Comments)    Numbness of hands and feet  . Ciprofloxacin  GI Intolerance    Also has Stage 3 kidney failure  . Gabapentin  Other (See Comments)    headaches  . Methadone Itching  . Propranolol Hypotension  . Topiramate Other (See Comments)    Mood swings  . Buprenorphine Hcl Other (See Comments)    States she has never taken this  . Morphine Itching    Medications Current Outpatient Medications  Medication Sig Dispense Refill  . ascorbic acid (VITAMIN C) 500 mg tablet Take 500 mg by mouth daily.    SABRA atorvastatin (LIPITOR) 20 mg tablet Take 1 tablet (20 mg total) by  mouth daily. 90 tablet 3  . buPROPion  (WELLBUTRIN  XL) 300 mg 24 hr tablet TAKE 1 TABLET BY MOUTH DAILY IN THE MORNING 90 tablet 0  . cyclobenzaprine  (FLEXERIL ) 10 mg tablet Take 10 mg by mouth 3 (three) times a day as needed.    . cycloSPORINE (Restasis MultiDose) 0.05 % drop Administer 1 drop into each eyes 2 (two) times a day. 16.5 mL 3  . famotidine (Pepcid) 20 mg tablet Take 1 tablet (20 mg total) by mouth 2 (two) times a day. 180 tablet 3  . fluticasone propionate (FLONASE) 50 mcg/spray nasal spray Administer 1 spray into each nostril daily. 16 g 5  . levothyroxine  (SYNTHROID ) 112 mcg tablet Take 112 mcg by mouth Once Daily.    . nitroglycerin (NITROSTAT) 0.4 mg SL tablet Place 0.4 mg under the tongue every 5 (five) minutes as needed for chest pain. 25 tablet 1  . ondansetron  (ZOFRAN ) 4 mg tablet Take 1 tablet (4 mg total) by mouth every 8 (eight) hours as needed for nausea or vomiting. 20 tablet 5  . polyethylene glycol 400 (Blink  Gel Tears) 0.25 % drpg Administer  into affected eye(s).    . potassium gluconate 600 mg (99 mg) tab Take 99 mg by mouth.    . rizatriptan MLT (MAXALT-MLT) 10 mg disintegrating tablet Dissolve 1 tablet (10 mg total) on tongue as needed for migraine. 30 tablet 5  . traZODone  (DESYREL ) 50 mg tablet TAKE 1 TO 2 TABLETS AT NIGHT AS NEEDED 180 tablet 3  . ALPRAZolam (XANAX) 0.5 mg tablet Take 1 tablet (0.5 mg total) by mouth 2 (two) times a day as needed for anxiety. 60 tablet 2   No current facility-administered medications for this visit.    Family history Family History  Problem Relation Name Age of Onset  . Migraines Mother    . Dementia Mother    . Stroke Mother    . Hypertension Mother    . Colon polyps Mother    . Depression Mother    . Hypertension Father    . Diabetes Father    . Bipolar disorder Father         he died in 03-03-2012 at the age of 74; his bipolar was never treated  . Sleep apnea Father    . Sleep apnea Sister    . ADD / ADHD Sister    . Sleep apnea Brother    . Depression Brother    . Dementia Maternal Uncle    . Colon cancer Paternal Aunt    . Dementia Maternal Grandmother    . Migraines Maternal Grandmother    . Familial polyposis Son    . Breast cancer Other matgmother   . Headaches Daughter    . Blindness Neg Hx    . Macular degeneration Neg Hx    . Retinal detachment Neg Hx    . Ulcerative colitis Neg Hx    . Crohn's disease Neg Hx    . Seizures Neg Hx    . Parkinsonism Neg Hx    . Glaucoma Neg Hx      Social history Social History   Tobacco Use  Smoking Status Former  . Current packs/day: 0.00  . Types: Cigarettes  . Quit date: 07/27/1996  . Years since quitting: 26.6  . Passive exposure: Past  Smokeless Tobacco Never  Tobacco Comments   Hx of smoking occasionally from age 29 yrs old to 58 yrs old; none since    Surgical history Past  Surgical History:  Procedure Laterality Date  . ADENOIDECTOMY  1983   Procedure: ADENOIDECTOMY  .  APPENDECTOMY     Procedure: APPENDECTOMY  . BAND HEMORRHOIDECTOMY     Procedure: BAND HEMORRHOIDECTOMY  . BLADDER REPAIR  01/2016   Procedure: BLADDER REPAIR; Sling  . CESAREAN SECTION, UNSPECIFIED  1997, 2009   Procedure: CESAREAN SECTION  . ENDOMETRIAL ABLATION     Procedure: ENDOMETRIAL ABLATION  . INCONTINENCE SURGERY  01/03/2016   Procedure: INCONTINENCE SURGERY  . REFRACTIVE SURGERY Bilateral    Procedure: REFRACTIVE SURGERY; patient was in her mid 55's; Dr. Meridee  . TOTAL THYROIDECTOMY     Procedure: TOTAL THYROIDECTOMY  . WISDOM TOOTH EXTRACTION     Procedure: WISDOM TOOTH EXTRACTION    Preventive Care Health Maintenance  Topic Date Due  . ZOSTER VACCINE (2 of 2) 04/25/2021  . Cervical Cancer Screening  12/27/2021  . Influenza Vaccine (1) 08/30/2023 (Originally 10/01/2022)  . Hepatitis B Vaccines (1 of 3 - 19+ 3-dose series) 09/08/2023 (Originally 01/16/1984)  . HIV Screening  09/08/2023 (Originally 01/16/1983)  . COVID-19 Vaccine (7 - 2024-25 season) 03/11/2024 (Originally 11/01/2022)  . Comprehensive Annual Visit  09/08/2023  . Depression Monitoring  09/09/2023  . Breast Cancer Screening (Mammogram)  01/27/2024  . Diabetes Screening  03/08/2024  . Colorectal Cancer Screening  06/24/2025  . DTaP/Tdap/Td Vaccines (2 - Td or Tdap) 12/27/2028  . Adult RSV (60+ Years or Pregnancy) (1 - 1-dose 75+ series) 01/16/2040  . Hepatitis C Screening  Completed  . Pneumococcal Vaccine: Pediatrics (0 to 5 years) and At-Risk Patients (6-64 Years)  Aged Out  . HIB Vaccines  Aged Out  . IPV Vaccines  Aged Out  . Hepatitis A Vaccines  Aged Out  . Meningococcal Conjugate (ACWY) Vaccine  Aged Out  . Rotavirus Vaccines  Aged Out  . HPV Vaccines  Aged Out  . Depression Screening  Discontinued     Immunization History  Administered Date(s) Administered  . Influenza, Injectable, Quadrivalent, Preservative Free 01/06/2013, 12/19/2014, 03/01/2017, 12/02/2017, 11/15/2018, 12/29/2019,  01/07/2021  . Influenza, Unspecified 04/21/2012, 01/06/2013, 12/07/2013, 12/19/2014, 12/02/2017  . Pfizer SARS-CoV-2 Bivalent 12+ yrs 11/29/2020  . Pfizer SARS-CoV-2 Primary Series 12+ yrs 05/18/2019, 06/13/2019, 02/02/2020, 06/28/2020, 03/13/2022  . Tdap 12/28/2018  . Zoster, Recombinant 02/28/2021     ROS  Review of Systems  Constitutional:  Negative for appetite change, chills and fever.  Eyes:  Negative for visual disturbance.  Respiratory:  Negative for chest tightness, shortness of breath and wheezing.   Cardiovascular:  Negative for chest pain and palpitations.  Gastrointestinal:  Negative for abdominal pain, blood in stool, diarrhea, nausea and vomiting.  Genitourinary:  Negative for difficulty urinating, dysuria, flank pain and hematuria.  Skin:  Negative for rash and wound.  Neurological:  Negative for dizziness, seizures, syncope and headaches.  Psychiatric/Behavioral:  Negative for agitation, behavioral problems, confusion, self-injury and suicidal ideas.   All other systems reviewed and are negative.   Review of Systems - All other systems reviewed are negative except as noted above.  OBJECTIVE  BP 123/85 (BP Location: Left arm, Patient Position: Sitting)   Pulse 74   Resp 14   Ht 1.753 m (5' 9)   Wt 103 kg (227 lb 6.4 oz)   SpO2 95%   BMI 33.58 kg/m    PHYSICAL:  Physical Exam Vitals and nursing note reviewed.  Constitutional:      General: She is not in acute distress.    Appearance: Normal appearance.  HENT:  Head: Normocephalic and atraumatic.  Eyes:     Pupils: Pupils are equal, round, and reactive to light.  Neck:     Vascular: No carotid bruit.  Cardiovascular:     Rate and Rhythm: Normal rate and regular rhythm.     Heart sounds: No murmur heard.    No friction rub. No gallop.  Pulmonary:     Effort: Pulmonary effort is normal. No respiratory distress.     Breath sounds: No wheezing, rhonchi or rales.  Abdominal:     General: Bowel  sounds are normal.     Palpations: Abdomen is soft.     Tenderness: There is no abdominal tenderness.  Musculoskeletal:     Cervical back: Normal range of motion and neck supple.     Right lower leg: No edema.     Left lower leg: No edema.  Skin:    General: Skin is warm.     Findings: No rash.  Neurological:     General: No focal deficit present.     Mental Status: She is alert and oriented to person, place, and time.  Psychiatric:        Mood and Affect: Mood normal.        Behavior: Behavior normal.    ASSESSMENT/PLAN:  1. Postoperative hypothyroidism (Primary) Advised patient to follow-up with her endocrinologist to repeat TSH level, as she has concerned about recent weight gain and fatigue.  2. Pure hypercholesterolemia Lab Results  Component Value Date   LDLCALC 71 03/09/2023  Continue lifestyle modification, continue atorvastatin 20 mg daily.  3. Essential hypertension BP Readings from Last 3 Encounters:  03/12/23 123/85  03/11/23 125/86  11/24/22 102/72  Continue low-salt, DASH diet and exercise,  4. Stage 3a chronic kidney disease (CMD) Lab Results  Component Value Date   CREATININE 1.10 03/09/2023   BUN 14 03/09/2023   NA 137 03/09/2023   K 4.4 03/09/2023   CL 99 03/09/2023   CO2 30 03/09/2023  Stay well-hydrated, avoid nephrotoxin.  5. Bipolar 1 disorder, depressed (HCC) Stable, continue stress release techniques, follow-up with behavioral health.  6. Chronic daily headache Headache is better, it happens every 2 to 3 weeks.  Continue Maxalt as needed.  7. GAD (generalized anxiety disorder) Continue stress release techniques, Patient would like to wean off, otherwise patient to gradually decrease the dose every 2 weeks, instruction given. - ALPRAZolam (XANAX) 0.5 mg tablet; Take 1 tablet (0.5 mg total) by mouth 2 (two) times a day as needed for anxiety.  Dispense: 60 tablet; Refill: 2  8. Vitamin D  deficiency Continue vitamin D   supplement.   Return in about 6 months (around 09/09/2023) for 40 min CPE and htn, hld. gad, .  Goals of care discussed with patient including medication compliance and adequate follow up. Patient verbalizesunderstanding and in agreement with the above plan. All questions answered.   I agree the documentation is accurate and complete.   Future Appointments  Date Time Provider Department Center  12/06/2023 10:00 AM Lynwood Arthea Plover, MD [facility] [address]     This document was created using the aidof voice recognition Dragon dictation software.

## 2023-04-30 ENCOUNTER — Other Ambulatory Visit (HOSPITAL_COMMUNITY): Payer: Self-pay | Admitting: Psychiatry

## 2023-06-15 ENCOUNTER — Ambulatory Visit (INDEPENDENT_AMBULATORY_CARE_PROVIDER_SITE_OTHER): Payer: Medicare HMO | Admitting: Psychiatry

## 2023-06-15 ENCOUNTER — Encounter (HOSPITAL_COMMUNITY): Payer: Self-pay | Admitting: Psychiatry

## 2023-06-15 VITALS — BP 162/90 | HR 92 | Ht 69.0 in | Wt 232.0 lb

## 2023-06-15 DIAGNOSIS — F319 Bipolar disorder, unspecified: Secondary | ICD-10-CM

## 2023-06-15 DIAGNOSIS — F4310 Post-traumatic stress disorder, unspecified: Secondary | ICD-10-CM

## 2023-06-15 DIAGNOSIS — F411 Generalized anxiety disorder: Secondary | ICD-10-CM

## 2023-06-15 MED ORDER — ARIPIPRAZOLE 2 MG PO TABS: 2.0000 mg | ORAL_TABLET | Freq: Every day | ORAL | 0 refills | Status: DC

## 2023-06-15 MED ORDER — BUPROPION HCL ER (XL) 150 MG PO TB24: 150.0000 mg | ORAL_TABLET | Freq: Every day | ORAL | 1 refills | Status: DC

## 2023-06-15 MED ORDER — FLUOXETINE HCL 10 MG PO CAPS: 10.0000 mg | ORAL_CAPSULE | Freq: Every day | ORAL | 2 refills | Status: DC
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
## 2023-07-06 ENCOUNTER — Ambulatory Visit (HOSPITAL_COMMUNITY): Admitting: Psychiatry

## 2023-07-08 ENCOUNTER — Encounter (HOSPITAL_COMMUNITY): Payer: Self-pay | Admitting: Psychiatry

## 2023-07-08 ENCOUNTER — Ambulatory Visit (INDEPENDENT_AMBULATORY_CARE_PROVIDER_SITE_OTHER): Admitting: Psychiatry

## 2023-07-08 VITALS — BP 123/88 | HR 92 | Ht 69.0 in | Wt 232.0 lb

## 2023-07-08 DIAGNOSIS — F4310 Post-traumatic stress disorder, unspecified: Secondary | ICD-10-CM

## 2023-07-08 DIAGNOSIS — F411 Generalized anxiety disorder: Secondary | ICD-10-CM | POA: Diagnosis not present

## 2023-07-08 DIAGNOSIS — F319 Bipolar disorder, unspecified: Secondary | ICD-10-CM | POA: Diagnosis not present

## 2023-07-08 MED ORDER — BUPROPION HCL ER (SR) 100 MG PO TB12: 100.0000 mg | ORAL_TABLET | Freq: Every day | ORAL | 0 refills | Status: DC

## 2023-07-08 MED ORDER — ARIPIPRAZOLE 5 MG PO TABS: 5.0000 mg | ORAL_TABLET | Freq: Every day | ORAL | 0 refills | Status: DC
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
## 2023-07-29 ENCOUNTER — Encounter (HOSPITAL_COMMUNITY): Payer: Self-pay | Admitting: Psychiatry

## 2023-07-29 ENCOUNTER — Ambulatory Visit (INDEPENDENT_AMBULATORY_CARE_PROVIDER_SITE_OTHER): Admitting: Psychiatry

## 2023-07-29 VITALS — BP 128/88 | HR 89 | Ht 69.0 in | Wt 237.0 lb

## 2023-07-29 DIAGNOSIS — F319 Bipolar disorder, unspecified: Secondary | ICD-10-CM | POA: Diagnosis not present

## 2023-07-29 DIAGNOSIS — F411 Generalized anxiety disorder: Secondary | ICD-10-CM

## 2023-07-29 DIAGNOSIS — F4310 Post-traumatic stress disorder, unspecified: Secondary | ICD-10-CM

## 2023-07-29 MED ORDER — ARIPIPRAZOLE 5 MG PO TABS: 5.0000 mg | ORAL_TABLET | Freq: Every day | ORAL | 0 refills | Status: DC

## 2023-07-29 MED ORDER — FLUOXETINE HCL 20 MG PO CAPS
20.0000 mg | ORAL_CAPSULE | Freq: Every day | ORAL | 0 refills | Status: DC
Start: 2023-07-29 — End: 2023-08-24

## 2023-07-29 MED ORDER — BUPROPION HCL ER (SR) 100 MG PO TB12: 100.0000 mg | ORAL_TABLET | Freq: Every day | ORAL | 0 refills | Status: DC
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
## 2023-08-11 ENCOUNTER — Telehealth (HOSPITAL_COMMUNITY): Payer: Self-pay | Admitting: *Deleted

## 2023-08-11 NOTE — Telephone Encounter (Signed)
 PATIENT CALLED TO GIVER HER 2 WEEK REPORT   DECREASED ALPRAZolam (XANAX) 1 MG tablet  & FEELING OK  ALPRAZolam (XANAX) 1 MG tablet   FLUoxetine  (PROZAC ) 20 MG capsule   buPROPion  ER (WELLBUTRIN  SR) 100 MG 12 hr tablet   Not FEELING AS GOOD AS THOUGHT SHE WOULD  --  FELLING TIRED & DEPRESSED

## 2023-08-12 NOTE — Telephone Encounter (Signed)
 Spoke with patient & informed per provider :: Noted. We did increase prozac  last visit. Call back for concerns , consider therapy and early appointment if needed

## 2023-08-24 ENCOUNTER — Encounter (HOSPITAL_COMMUNITY): Payer: Self-pay | Admitting: Psychiatry

## 2023-08-24 ENCOUNTER — Ambulatory Visit (INDEPENDENT_AMBULATORY_CARE_PROVIDER_SITE_OTHER): Admitting: Psychiatry

## 2023-08-24 VITALS — BP 128/89 | HR 82 | Ht 69.0 in | Wt 243.0 lb

## 2023-08-24 DIAGNOSIS — F411 Generalized anxiety disorder: Secondary | ICD-10-CM | POA: Diagnosis not present

## 2023-08-24 DIAGNOSIS — F4310 Post-traumatic stress disorder, unspecified: Secondary | ICD-10-CM | POA: Diagnosis not present

## 2023-08-24 DIAGNOSIS — F319 Bipolar disorder, unspecified: Secondary | ICD-10-CM | POA: Diagnosis not present

## 2023-08-24 MED ORDER — FLUOXETINE HCL 20 MG PO CAPS: 20.0000 mg | ORAL_CAPSULE | Freq: Every day | ORAL | 1 refills | Status: DC

## 2023-08-24 MED ORDER — ARIPIPRAZOLE 5 MG PO TABS: 5.0000 mg | ORAL_TABLET | Freq: Every day | ORAL | 1 refills | Status: DC

## 2023-08-24 MED ORDER — BUPROPION HCL ER (SR) 100 MG PO TB12: 100.0000 mg | ORAL_TABLET | Freq: Every day | ORAL | 1 refills | Status: DC
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
## 2023-09-09 ENCOUNTER — Ambulatory Visit (HOSPITAL_COMMUNITY): Admitting: Psychiatry

## 2023-09-28 ENCOUNTER — Telehealth (HOSPITAL_COMMUNITY): Payer: Self-pay

## 2023-09-28 NOTE — Telephone Encounter (Signed)
 Medication management - Call with patient, after she left a message requesting a call back to discuss a possible medication change. Patient stated she has been more depressed recently, sleeping a lot and does not want to get out to see anyone. Patient reported she has no energy or interest in doing anything and questions if she can go up on her Prozac  to possibly 30 mg and stating she will be coming in on 10/19/23 so this would give it a chance to see if an increase would work.  Patient denied any suicidal or homicidal ideations, plans, intent or means and agreed to pass on the request to Dr. Geralene.

## 2023-09-29 NOTE — Telephone Encounter (Signed)
 Spoke to patient. Informed her of the following, also made appt for tomorrow to see Yolanda Jordan

## 2023-09-30 ENCOUNTER — Ambulatory Visit (HOSPITAL_COMMUNITY): Admitting: Psychiatry

## 2023-10-04 ENCOUNTER — Encounter: Payer: Self-pay | Admitting: Emergency Medicine

## 2023-10-04 ENCOUNTER — Telehealth: Payer: Self-pay | Admitting: Urology

## 2023-10-04 ENCOUNTER — Ambulatory Visit
Admission: EM | Admit: 2023-10-04 | Discharge: 2023-10-04 | Disposition: A | Discharge status: Home / Self Care | Attending: Family Medicine | Admitting: Family Medicine

## 2023-10-04 DIAGNOSIS — N3001 Acute cystitis with hematuria: Secondary | ICD-10-CM | POA: Diagnosis not present

## 2023-10-04 DIAGNOSIS — N1831 Chronic kidney disease, stage 3a: Secondary | ICD-10-CM | POA: Diagnosis not present

## 2023-10-04 DIAGNOSIS — R350 Frequency of micturition: Secondary | ICD-10-CM | POA: Diagnosis not present

## 2023-10-04 DIAGNOSIS — F319 Bipolar disorder, unspecified: Secondary | ICD-10-CM | POA: Insufficient documentation

## 2023-10-04 DIAGNOSIS — E669 Obesity, unspecified: Secondary | ICD-10-CM | POA: Diagnosis not present

## 2023-10-04 DIAGNOSIS — G4733 Obstructive sleep apnea (adult) (pediatric): Secondary | ICD-10-CM | POA: Diagnosis not present

## 2023-10-04 DIAGNOSIS — Z6833 Body mass index (BMI) 33.0-33.9, adult: Secondary | ICD-10-CM | POA: Diagnosis not present

## 2023-10-04 LAB — POCT URINALYSIS DIP (MANUAL ENTRY)
Bilirubin, UA: NEGATIVE
Glucose, UA: 100 mg/dL — AB
Ketones, POC UA: NEGATIVE mg/dL
Nitrite, UA: POSITIVE — AB
Protein Ur, POC: NEGATIVE mg/dL
Spec Grav, UA: 1.015 (ref 1.010–1.025)
Urobilinogen, UA: 1 U/dL
pH, UA: 6 (ref 5.0–8.0)

## 2023-10-04 MED ORDER — CEFDINIR 300 MG PO CAPS
300.0000 mg | ORAL_CAPSULE | Freq: Two times a day (BID) | ORAL | 0 refills | Status: AC
Start: 2023-10-04 — End: 2023-10-11

## 2023-10-04 NOTE — Telephone Encounter (Signed)
 Pt called in about recurrent UTI and setting new patient app. Stated she needs to get in asap she stated that she is going to go to urgent care at [facility]. I told pt to have them put in a referral for urology and to call us  afterwards since she has not been seen here since 04/2022.

## 2023-10-04 NOTE — ED Triage Notes (Signed)
 Patient c/o possible UTI, urinary frequency, urgency and hematuria x 2 days.  Patient has taken AZO to help w/discomfort.

## 2023-10-04 NOTE — Discharge Instructions (Addendum)
 Advised patient to take medication as directed with food to completion.  Encouraged increase daily water  intake to 64 ounces per day while taking this medication.  Advised we will follow-up with urine culture results once received.  Advised if symptoms worsening or unresolved please follow-up with your PCP,  urology, or here for further evaluation.

## 2023-10-04 NOTE — ED Provider Notes (Signed)
 [facility]    CSN: [id number] Arrival date & time: 10/04/23  1252      History   Chief Complaint Chief Complaint  Patient presents with   Urinary Frequency    HPI Yolanda Jordan is a 58 y.o. female.   HPI 58 year old female presents with possible UTI, urinary frequency urgency and hematuria for 2 days.  Patient reports taking AZO without relief.  PMH significant for obesity, bipolar 1 disorder, and OSA.  Past Medical History:  Diagnosis Date   Anxiety    Back pain    Cancer (HCC) 2000   thyroid  cancer   Chronic kidney disease, stage 3 (HCC)    Constipation    Depression    Drug use    Dyspnea    panic attacks, high humidity   GERD (gastroesophageal reflux disease)    High blood pressure    No HTN as of 2024   Hx of thyroid  cancer    Hypothyroidism    Joint pain    Memory loss    Migraines    PONV (postoperative nausea and vomiting)    Sleep apnea    Hx. Negative study 2023   SOB (shortness of breath)    Thyroid  disease    Vitamin D  deficiency     Patient Active Problem List   Diagnosis Date Noted   Insulin  resistance 07/18/2019   Class 2 severe obesity with serious comorbidity and body mass index (BMI) of 37.0 to 37.9 in adult (HCC) 06/22/2019   Stage 3a chronic kidney disease (HCC) 05/09/2019   Essential hypertension 01/12/2018   Myopia with astigmatism and presbyopia, bilateral 07/03/2016   Family history of colonic polyps 06/30/2016   Gastroesophageal reflux disease without esophagitis 06/30/2016   Juvenile polyposis syndrome 06/30/2016   Prediabetes 09/24/2015   OSA (obstructive sleep apnea) 08/06/2015   Vitamin D  deficiency 03/13/2015   H/O radioactive iodine thyroid  ablation 12/03/2013   Migraine with aura and without status migrainosus, not intractable 05/10/2013   Bipolar 1 disorder, depressed (HCC) 11/04/2012   Hypothyroidism 11/04/2012   History of thyroid  cancer 07/28/2011    Past Surgical History:  Procedure Laterality  Date   APPENDECTOMY     BLADDER SUSPENSION N/A 01/03/2016   Procedure: TRANSVAGINAL TAPE (TVT) PROCEDURE;  Surgeon: Krystal Deaner, MD;  Location: [facility];  Service: Gynecology;  Laterality: N/A;   CESAREAN SECTION     x2   CYSTOSCOPY N/A 01/03/2016   Procedure: CYSTOSCOPY;  Surgeon: Krystal Deaner, MD;  Location: [facility];  Service: Gynecology;  Laterality: N/A;   HEMORROIDECTOMY     age 8   LASIK Bilateral    PERINEOPLASTY N/A 01/03/2016   Procedure: PERINEOPLASTY;  Surgeon: Krystal Deaner, MD;  Location: [facility];  Service: Gynecology;  Laterality: N/A;   THYROIDECTOMY  2000   TUBAL LIGATION     uterine ablation     05/2010   WISDOM TOOTH EXTRACTION      OB History     Gravida  5   Para  5   Term      Preterm      AB      Living         SAB      IAB      Ectopic      Multiple      Live Births               Home Medications    Prior to Admission medications  Medication Sig Start Date End Date Taking? Authorizing Provider  ALPRAZolam (XANAX) 1 MG tablet Take 0.5-1 mg by mouth 2 (two) times daily as needed for anxiety.   Yes [provider]  ARIPiprazole  (ABILIFY ) 5 MG tablet Take 1 tablet (5 mg total) by mouth daily. 08/24/23  Yes Geralene Kaiser, MD  ascorbic acid (VITAMIN C) 500 MG tablet Take 500 mg by mouth daily.   Yes [provider]  atorvastatin (LIPITOR) 20 MG tablet Take 20 mg by mouth daily. 02/24/22  Yes [provider]  buPROPion  ER (WELLBUTRIN  SR) 100 MG 12 hr tablet Take 1 tablet (100 mg total) by mouth daily. 08/24/23  Yes Geralene Kaiser, MD  Calcium Carb-Cholecalciferol (CALCIUM 500/VITAMIN D  PO) Take 2 each by mouth daily. Gummies   Yes [provider]  cefdinir  (OMNICEF ) 300 MG capsule Take 1 capsule (300 mg total) by mouth 2 (two) times daily for 7 days. 10/04/23 10/11/23 Yes Teddy Sharper, FNP  Cholecalciferol 50 MCG (2000 UT) CAPS Take 2,000 Units by mouth daily.   Yes [provider]   Cyanocobalamin (B-12 PO) Take 1 tablet by mouth daily.   Yes [provider]  famotidine (PEPCID) 20 MG tablet Take 20 mg by mouth 2 (two) times daily.   Yes [provider]  FLUoxetine  (PROZAC ) 20 MG capsule Take 1 capsule (20 mg total) by mouth daily. 08/24/23 08/23/24 Yes Geralene Kaiser, MD  levothyroxine  (SYNTHROID ) 112 MCG tablet Take 112 mcg by mouth daily before breakfast.   Yes [provider]  nitroGLYCERIN (NITROSTAT) 0.4 MG SL tablet Place 0.4 mg under the tongue every 5 (five) minutes as needed for chest pain.   Yes [provider]  ondansetron  (ZOFRAN ) 4 MG tablet Take 4-8 mg by mouth every 8 (eight) hours as needed for nausea or vomiting. 09/08/22  Yes [provider]  Polyethylene Glycol 400 (BLINK TEARS) 0.25 % SOLN Place 1 drop into both eyes as needed (dry eyes).   Yes [provider]  Potassium 99 MG TABS Take 99 mg by mouth every other day.   Yes [provider]  rizatriptan (MAXALT) 10 MG tablet Take 10 mg by mouth every 2 (two) hours as needed for migraine. 10/16/15  Yes [provider]  Sennosides (EX-LAX) 15 MG CHEW Chew 30-45 mg by mouth daily as needed (constipation).   Yes [provider]  Tenapanor HCl (IBSRELA) 50 MG TABS Take 50 mg by mouth in the morning and at bedtime.   Yes [provider]  fluticasone (FLONASE) 50 MCG/ACT nasal spray Place 1 spray into the nose daily. 09/08/22 09/08/23  [provider]    Family History Family History  Problem Relation Age of Onset   Healthy Mother    Stroke Mother    Depression Mother    Hypertension Father    Diabetes Father    Renal Disease Father    High Cholesterol Father    Heart disease Father    Kidney disease Father    Anxiety disorder Father    Bipolar disorder Father    Sleep apnea Father    Obesity Father     Social History Social History   Tobacco Use   Smoking status: Never   Smokeless tobacco: Never  Vaping  Use   Vaping status: Former  Substance Use Topics   Alcohol use: Never   Drug use: Not Currently    Types: Marijuana     Allergies   Buspirone, Ciprofloxacin , Gabapentin , and Morphine and  codeine   Review of Systems Review of Systems  Genitourinary:  Positive for dysuria.  All other systems reviewed and are negative.    Physical Exam Triage Vital Signs ED Triage Vitals  Encounter Vitals Group     BP 10/04/23 1309 (!) 143/83     Girls Systolic BP Percentile --      Girls Diastolic BP Percentile --      Boys Systolic BP Percentile --      Boys Diastolic BP Percentile --      Pulse Rate 10/04/23 1309 83     Resp 10/04/23 1309 18     Temp 10/04/23 1309 98.8 F (37.1 C)     Temp Source 10/04/23 1309 Oral     SpO2 10/04/23 1309 96 %     Weight 10/04/23 1310 225 lb (102.1 kg)     Height 10/04/23 1310 5' 9 (1.753 m)     Head Circumference --      Peak Flow --      Pain Score 10/04/23 1310 2     Pain Loc --      Pain Education --      Exclude from Growth Chart --    No data found.  Updated Vital Signs BP (!) 143/83 (BP Location: Right Arm)   Pulse 83   Temp 98.8 F (37.1 C) (Oral)   Resp 18   Ht 5' 9 (1.753 m)   Wt 225 lb (102.1 kg)   SpO2 96%   BMI 33.23 kg/m    Physical Exam Vitals and nursing note reviewed.  Constitutional:      Appearance: Normal appearance. She is normal weight.  HENT:     Head: Normocephalic and atraumatic.     Mouth/Throat:     Mouth: Mucous membranes are moist.     Pharynx: Oropharynx is clear.  Eyes:     Extraocular Movements: Extraocular movements intact.     Conjunctiva/sclera: Conjunctivae normal.     Pupils: Pupils are equal, round, and reactive to light.  Cardiovascular:     Rate and Rhythm: Normal rate and regular rhythm.     Pulses: Normal pulses.     Heart sounds: Normal heart sounds.  Pulmonary:     Effort: Pulmonary effort is normal.     Breath sounds: Normal breath sounds. No wheezing, rhonchi or rales.   Musculoskeletal:        General: Normal range of motion.  Skin:    General: Skin is warm and dry.  Neurological:     General: No focal deficit present.     Mental Status: She is alert and oriented to person, place, and time. Mental status is at baseline.  Psychiatric:        Mood and Affect: Mood normal.        Behavior: Behavior normal.      UC Treatments / Results  Labs (all labs ordered are listed, but only abnormal results are displayed) Labs Reviewed  POCT URINALYSIS DIP (MANUAL ENTRY) - Abnormal; Notable for the following components:      Result Value   Color, UA orange (*)    Glucose, UA =100 (*)    Blood, UA moderate (*)    Nitrite, UA Positive (*)    Leukocytes, UA Trace (*)    All other components within normal limits  URINE CULTURE    EKG   Radiology No results found.  Procedures Procedures (including critical care time)  Medications Ordered in UC Medications - No  data to display  Initial Impression / Assessment and Plan / UC Course  I have reviewed the triage vital signs and the nursing notes.  Pertinent labs & imaging results that were available during my care of the patient were reviewed by me and considered in my medical decision making (see chart for details).     MDM: 1.  Acute cystitis with hematuria-Rx'd cefdinir  300 mg capsule: Take 1 capsule twice daily x 7 days UA revealed above, urine culture ordered; 2.  Urinary frequency-Rx'd cefdinir  300 mg capsule: Take 1 capsule twice daily x 7 days UA revealed above, urine culture ordered. Advised patient to take medication as directed with food to completion.  Encouraged increase daily water  intake to 64 ounces per day while taking this medication.  Advised we will follow-up with urine culture results once received.  Advised if symptoms worsening or unresolved please follow-up with your PCP, [facility], or here for further evaluation.  Patient discharged home, hemodynamically stable. Final  Clinical Impressions(s) / UC Diagnoses   Final diagnoses:  Stage 3a chronic kidney disease (HCC)  Acute cystitis with hematuria  Urinary frequency     Discharge Instructions      Advised patient to take medication as directed with food to completion.  Encouraged increase daily water  intake to 64 ounces per day while taking this medication.  Advised we will follow-up with urine culture results once received.  Advised if symptoms worsening or unresolved please follow-up with your PCP, [facility], or here for further evaluation.     ED Prescriptions     Medication Sig Dispense Auth. Provider   cefdinir  (OMNICEF ) 300 MG capsule Take 1 capsule (300 mg total) by mouth 2 (two) times daily for 7 days. 14 capsule Magaret Justo, FNP      PDMP not reviewed this encounter.   Teddy Sharper, FNP 10/04/23 1349

## 2023-10-05 ENCOUNTER — Encounter (HOSPITAL_COMMUNITY): Payer: Self-pay | Admitting: Psychiatry

## 2023-10-05 ENCOUNTER — Ambulatory Visit (INDEPENDENT_AMBULATORY_CARE_PROVIDER_SITE_OTHER): Admitting: Psychiatry

## 2023-10-05 VITALS — BP 132/84 | HR 77 | Temp 98.7°F | Ht 68.0 in | Wt 255.0 lb

## 2023-10-05 DIAGNOSIS — F411 Generalized anxiety disorder: Secondary | ICD-10-CM

## 2023-10-05 DIAGNOSIS — F4310 Post-traumatic stress disorder, unspecified: Secondary | ICD-10-CM

## 2023-10-05 DIAGNOSIS — F319 Bipolar disorder, unspecified: Secondary | ICD-10-CM | POA: Diagnosis not present

## 2023-10-05 LAB — URINE CULTURE: Culture: <10000 — AB

## 2023-10-05 MED ORDER — ARIPIPRAZOLE 5 MG PO TABS: 2.5000 mg | ORAL_TABLET | Freq: Every day | ORAL | 1 refills | Status: DC

## 2023-10-05 MED ORDER — FLUOXETINE HCL 10 MG PO CAPS: 10.0000 mg | ORAL_CAPSULE | Freq: Every day | ORAL | 1 refills | Status: DC

## 2023-10-05 MED ORDER — BUPROPION HCL ER (SR) 100 MG PO TB12: 100.0000 mg | ORAL_TABLET | Freq: Every day | ORAL | 1 refills | Status: DC
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
## 2023-10-06 ENCOUNTER — Ambulatory Visit (HOSPITAL_COMMUNITY): Payer: Self-pay

## 2023-10-11 ENCOUNTER — Ambulatory Visit (INDEPENDENT_AMBULATORY_CARE_PROVIDER_SITE_OTHER): Admitting: Licensed Clinical Social Worker

## 2023-10-11 DIAGNOSIS — F332 Major depressive disorder, recurrent severe without psychotic features: Secondary | ICD-10-CM | POA: Diagnosis not present

## 2023-10-11 DIAGNOSIS — F411 Generalized anxiety disorder: Secondary | ICD-10-CM | POA: Diagnosis not present
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
## 2023-10-12 NOTE — Progress Notes (Signed)
 Chief Complaint: Recurrent urinary tract infections  History of Present Illness:  Yolanda Jordan is a 58 y.o. female who is here for recurrent urinary tract infections.  She states that she gets a UTI almost every month.  Typical symptoms include frequency, urgency, nocturia, dysuria and minimal blood on her toilet tissue, occasionally in the urine.  Symptoms always clear with a course of oral antibiotics.  She does take AZO to help her symptoms as well.  She denies any abdominal pain, flank pain or fevers with these infections.  She is sexually active.  Her husband is on testosterone, and typically they are sexually active at least 5 times a week.  She does wonder if these infections are related to her sexual activity.  She does void after intercourse.  Past Medical History:  Past Medical History:  Diagnosis Date   Anxiety    Back pain    Cancer (HCC) 2000   thyroid  cancer   Chronic kidney disease, stage 3 (HCC)    Constipation    Depression    Drug use    Dyspnea    panic attacks, high humidity   GERD (gastroesophageal reflux disease)    High blood pressure    No HTN as of 2024   Hx of thyroid  cancer    Hypothyroidism    Joint pain    Memory loss    Migraines    PONV (postoperative nausea and vomiting)    Sleep apnea    Hx. Negative study 2023   SOB (shortness of breath)    Thyroid  disease    Vitamin D  deficiency     Past Surgical History:  Past Surgical History:  Procedure Laterality Date   APPENDECTOMY     BLADDER SUSPENSION N/A 01/03/2016   Procedure: TRANSVAGINAL TAPE (TVT) PROCEDURE;  Surgeon: Krystal Deaner, MD;  Location: [facility];  Service: Gynecology;  Laterality: N/A;   CESAREAN SECTION     x2   CYSTOSCOPY N/A 01/03/2016   Procedure: CYSTOSCOPY;  Surgeon: Krystal Deaner, MD;  Location: [facility];  Service: Gynecology;  Laterality: N/A;   HEMORROIDECTOMY     age 89   LASIK Bilateral    PERINEOPLASTY N/A 01/03/2016   Procedure: PERINEOPLASTY;  Surgeon:  Krystal Deaner, MD;  Location: [facility];  Service: Gynecology;  Laterality: N/A;   THYROIDECTOMY  2000   TUBAL LIGATION     uterine ablation     05/2010   WISDOM TOOTH EXTRACTION      Allergies:  Allergies  Allergen Reactions   Buspirone Other (See Comments)    Numbness of hands and feet   Ciprofloxacin      emesis   Gabapentin  Other (See Comments)    headaches   Morphine And Codeine Itching    Family History:  Family History  Problem Relation Age of Onset   Healthy Mother    Stroke Mother    Depression Mother    Hypertension Father    Diabetes Father    Renal Disease Father    High Cholesterol Father    Heart disease Father    Kidney disease Father    Anxiety disorder Father    Bipolar disorder Father    Sleep apnea Father    Obesity Father     Social History:  Social History   Tobacco Use   Smoking status: Never   Smokeless tobacco: Never  Vaping Use   Vaping status: Former  Substance Use Topics   Alcohol use: Never  Drug use: Not Currently    Types: Marijuana    Review of symptoms:  Constitutional:  Negative for unexplained weight loss, night sweats, fever, chills ENT:  Negative for nose bleeds, sinus pain, painful swallowing CV:  Negative for chest pain, shortness of breath, exercise intolerance, palpitations, loss of consciousness Resp:  Negative for cough, wheezing, shortness of breath GI:  Negative for nausea, vomiting, diarrhea, bloody stools GU:  Positives noted in HPI; otherwise negative for gross hematuria, dysuria, urinary incontinence Neuro:  Negative for seizures, poor balance, limb weakness, slurred speech Psych:  Negative for lack of energy, depression, anxiety Endocrine:  Negative for polydipsia, polyuria, symptoms of hypoglycemia (dizziness, hunger, sweating) Hematologic:  Negative for anemia, purpura, petechia, prolonged or excessive bleeding, use of anticoagulants  Allergic:  Negative for difficulty breathing or choking as a result of  exposure to anything; no shellfish allergy; no allergic response (rash/itch) to materials, foods  Physical exam: There were no vitals taken for this visit. GENERAL APPEARANCE:  Well appearing, well developed, well nourished, NAD HEENT: Atraumatic, Normocephalic. NECK: Normal appearance LUNGS: Normal inspiratory and expiratory excursion HEART: Regular Rate EXTREMITIES: Moves all extremities well.  Without clubbing, cyanosis, or edema. NEUROLOGIC:  Alert and oriented x 3, normal gait, CN II-XII grossly intact.  MENTAL STATUS:  Appropriate. SKIN:  Warm, dry and intact.    Results:  I have reviewed prior patient's records  I have reviewed referring/prior physicians records  I have reviewed urinalysis--clear today  I have reviewed prior urine cultures--most recent urine culture from last week grew no specific pathogen's  I reviewed prior imaging studies--CT stone protocol from 2022 revealed normal kidneys.  Bladder appeared normal.  Assessment: Recurrent urinary tract infections in postmenopausal female.  Significantly sexually active.  She is on an estrogen patch.  She does carry a fairly large urinary residual today which may predispose her to these infections   Plan: -I have recommended that she stay on her estrogen patch  -I recommended that she double void at least twice a day, void after intercourse  -I recommend vitamin C 500 mg twice a day, also a probiotic  -I will have her come back in 4 to 6 weeks to recheck symptoms, urine and her residual urine volume

## 2023-10-13 ENCOUNTER — Encounter: Payer: Self-pay | Admitting: Urology

## 2023-10-13 ENCOUNTER — Ambulatory Visit (INDEPENDENT_AMBULATORY_CARE_PROVIDER_SITE_OTHER): Admitting: Urology

## 2023-10-13 VITALS — BP 130/88 | HR 101 | Ht 68.0 in | Wt 250.0 lb

## 2023-10-13 DIAGNOSIS — N3001 Acute cystitis with hematuria: Secondary | ICD-10-CM | POA: Diagnosis not present

## 2023-10-13 DIAGNOSIS — Z780 Asymptomatic menopausal state: Secondary | ICD-10-CM

## 2023-10-13 DIAGNOSIS — R310 Gross hematuria: Secondary | ICD-10-CM

## 2023-10-13 DIAGNOSIS — Z87440 Personal history of urinary (tract) infections: Secondary | ICD-10-CM

## 2023-10-13 LAB — URINALYSIS, ROUTINE W REFLEX MICROSCOPIC
Bilirubin, UA: NEGATIVE
Glucose, UA: NEGATIVE
Ketones, UA: NEGATIVE
Leukocytes,UA: NEGATIVE
Nitrite, UA: NEGATIVE
Protein,UA: NEGATIVE
Specific Gravity, UA: <1.005 — ABNORMAL LOW (ref 1.005–1.030)
Urobilinogen, Ur: 0.2 mg/dL (ref 0.2–1.0)
pH, UA: 6 (ref 5.0–7.5)

## 2023-10-13 LAB — MICROSCOPIC EXAMINATION

## 2023-10-13 LAB — BLADDER SCAN AMB NON-IMAGING: Scan Result: 250

## 2023-10-19 ENCOUNTER — Ambulatory Visit (HOSPITAL_COMMUNITY): Admitting: Psychiatry

## 2023-10-29 ENCOUNTER — Other Ambulatory Visit (HOSPITAL_COMMUNITY): Payer: Self-pay | Admitting: Psychiatry

## 2023-11-02 ENCOUNTER — Ambulatory Visit (HOSPITAL_COMMUNITY): Admitting: Psychiatry

## 2023-11-04 ENCOUNTER — Ambulatory Visit (HOSPITAL_COMMUNITY): Admitting: Psychiatry

## 2023-11-09 ENCOUNTER — Encounter (HOSPITAL_COMMUNITY): Payer: Self-pay

## 2023-11-09 ENCOUNTER — Ambulatory Visit (HOSPITAL_COMMUNITY): Admitting: Licensed Clinical Social Worker

## 2023-11-09 DIAGNOSIS — F411 Generalized anxiety disorder: Secondary | ICD-10-CM

## 2023-11-09 DIAGNOSIS — F332 Major depressive disorder, recurrent severe without psychotic features: Secondary | ICD-10-CM

## 2023-11-09 DIAGNOSIS — F4310 Post-traumatic stress disorder, unspecified: Secondary | ICD-10-CM

## 2023-11-09 NOTE — Progress Notes (Signed)
   THERAPIST PROGRESS NOTE  Session Time: 8:00 AM to 8:40 AM  Participation Level: Active  Behavioral Response: CasualAlertappropriate  Type of Therapy: Individual Therapy  Treatment Goals addressed: Decrease depression, learn coping skills for depression, work on anxiety issues coping without medication for anxiety  ProgressTowards Goals: Progressing-completed treatment plan patient gave consent to complete in session noted progress already significant decrease in PHQ-9  Interventions: CBT, Strength-based, Supportive, and Other: Coping  Summary: Yolanda Jordan is a 57 y.o. female who presents with mother-in-law out which continues to be a positive thing until she has to take appointment starts to get nervous appointment on Thursday already nervous. Doesn't like to go places but fine at home if go somewhere anxious but will bring somewhere with her. Life is going.  She feels she is all right does not need to work on staying at home she is fine with that.  Decorating for fall around the house enjoying the cooler weather.  Thinking about going [place] the farmers market and therapist encouraging her to do it she is going to do it her way sit in the parking lot look around see what it looks like. Hope not crowded and not feel unsafe. Usually has a gun not today concern about safety issues that way. Therapist says try as already is a successful step toward progress by trying. Therapist worked on CBT issues as far as avoidance noting when you go it is not as scary as you thought you do not want to miss out you get used to it by going and develop coping skills. Reviewed treatment plan patient doing a lot better stop taking the medication no she should have done it without reviewed with Dr. But does feel better will review at appointment she stopped the Abilify  thinks this may have been what was dragging her down.  We did a PHQ-9 significantly decreased backing up her report.  We did treatment plan in  session patient gave consent to complete well in session.  We also reviewed nutrition assessment patient says needs to lose some weight.  Suicidal/Homicidal: No  Plan: Return again in 4 weeks.2.  Review whether patient went to the farmers market, review handouts for depression and anxiety to help with coping  Diagnosis: Major depressive disorder, recurrent severe generalized anxiety disorder, PTSD  Collaboration of Care: Other none needed  Patient/Guardian was advised Release of Information must be obtained prior to any record release in order to collaborate their care with an outside provider. Patient/Guardian was advised if they have not already done so to contact the registration department to sign all necessary forms in order for us  to release information regarding their care.   Consent: Patient/Guardian gives verbal consent for treatment and assignment of benefits for services provided during this visit. Patient/Guardian expressed understanding and agreed to proceed.   Ronal Sink, LCSW 11/09/2023

## 2023-11-23 ENCOUNTER — Ambulatory Visit (HOSPITAL_COMMUNITY): Admitting: Licensed Clinical Social Worker

## 2023-11-23 NOTE — Progress Notes (Unsigned)
 Assessment: Recurrent urinary tract infections in postmenopausal female.  Significantly sexually active.  She is on an estrogen patch.  She has a normal residual urine volume.  She has had improvement of symptoms with limiting frequency of sex   Plan: I sent in a prescription for as needed cephalexin   I will have her come back as needed    History of Present Illness:  8.13.2025: Initial visit  for recurrent urinary tract infections.  She states that she gets a UTI almost every month.  Typical symptoms include frequency, urgency, nocturia, dysuria and minimal blood on her toilet tissue, occasionally in the urine.  Symptoms always clear with a course of oral antibiotics.  She does take AZO to help her symptoms as well.  She denies any abdominal pain, flank pain or fevers with these infections.  She is sexually active.  Her husband is on testosterone, and typically they are sexually active at least 5 times a week.  She does wonder if these infections are related to her sexual activity.  She does void after intercourse.  9.24.2025: Since her first visit, she has had no UTIs.  Frequency of sexes down to once a week which is adequate for her and her husband.  Residual urine volume today was 50 mL.  Past Medical History:  Past Medical History:  Diagnosis Date   Anxiety    Back pain    Cancer (HCC) 2000   thyroid  cancer   Chronic kidney disease, stage 3 (HCC)    Constipation    Depression    Drug use    Dyspnea    panic attacks, high humidity   GERD (gastroesophageal reflux disease)    High blood pressure    No HTN as of 2024   Hx of thyroid  cancer    Hypothyroidism    Joint pain    Memory loss    Migraines    PONV (postoperative nausea and vomiting)    Sleep apnea    Hx. Negative study 2023   SOB (shortness of breath)    Thyroid  disease    Vitamin D  deficiency     Past Surgical History:  Past Surgical History:  Procedure Laterality Date   APPENDECTOMY     BLADDER  SUSPENSION N/A 01/03/2016   Procedure: TRANSVAGINAL TAPE (TVT) PROCEDURE;  Surgeon: Krystal Deaner, MD;  Location: [facility];  Service: Gynecology;  Laterality: N/A;   CESAREAN SECTION     x2   CYSTOSCOPY N/A 01/03/2016   Procedure: CYSTOSCOPY;  Surgeon: Krystal Deaner, MD;  Location: [facility];  Service: Gynecology;  Laterality: N/A;   HEMORROIDECTOMY     age 58   LASIK Bilateral    PERINEOPLASTY N/A 01/03/2016   Procedure: PERINEOPLASTY;  Surgeon: Krystal Deaner, MD;  Location: [facility];  Service: Gynecology;  Laterality: N/A;   THYROIDECTOMY  2000   TUBAL LIGATION     uterine ablation     05/2010   WISDOM TOOTH EXTRACTION      Allergies:  Allergies  Allergen Reactions   Buspirone Other (See Comments)    Numbness of hands and feet   Ciprofloxacin      emesis   Gabapentin  Other (See Comments)    headaches   Morphine And Codeine Itching    Family History:  Family History  Problem Relation Age of Onset   Healthy Mother    Stroke Mother    Depression Mother    Hypertension Father    Diabetes Father    Renal Disease Father  High Cholesterol Father    Heart disease Father    Kidney disease Father    Anxiety disorder Father    Bipolar disorder Father    Sleep apnea Father    Obesity Father     Social History:  Social History   Tobacco Use   Smoking status: Never   Smokeless tobacco: Never  Vaping Use   Vaping status: Former  Substance Use Topics   Alcohol use: Never   Drug use: Not Currently    Types: Marijuana    Review of symptoms:  Constitutional:  Negative for unexplained weight loss, night sweats, fever, chills ENT:  Negative for nose bleeds, sinus pain, painful swallowing CV:  Negative for chest pain, shortness of breath, exercise intolerance, palpitations, loss of consciousness Resp:  Negative for cough, wheezing, shortness of breath GI:  Negative for nausea, vomiting, diarrhea, bloody stools GU:  Positives noted in HPI; otherwise negative for gross  hematuria, dysuria, urinary incontinence Neuro:  Negative for seizures, poor balance, limb weakness, slurred speech Psych:  Negative for lack of energy, depression, anxiety Endocrine:  Negative for polydipsia, polyuria, symptoms of hypoglycemia (dizziness, hunger, sweating) Hematologic:  Negative for anemia, purpura, petechia, prolonged or excessive bleeding, use of anticoagulants  Allergic:  Negative for difficulty breathing or choking as a result of exposure to anything; no shellfish allergy; no allergic response (rash/itch) to materials, foods     Results:  I have reviewed prior patient's records  I have reviewed referring/prior physicians records  I have reviewed urinalysis--clear today  I have reviewed prior urine cultures--most recent urine culture from last week grew no specific pathogen's  I reviewed prior imaging studies--CT stone protocol from 2022 revealed normal kidneys.  Bladder appeared normal.

## 2023-11-24 ENCOUNTER — Ambulatory Visit: Admitting: Urology

## 2023-11-24 VITALS — BP 126/87 | HR 79 | Ht 69.0 in | Wt 250.0 lb

## 2023-11-24 DIAGNOSIS — Z87440 Personal history of urinary (tract) infections: Secondary | ICD-10-CM

## 2023-11-24 DIAGNOSIS — N3001 Acute cystitis with hematuria: Secondary | ICD-10-CM

## 2023-11-24 DIAGNOSIS — Z09 Encounter for follow-up examination after completed treatment for conditions other than malignant neoplasm: Secondary | ICD-10-CM

## 2023-11-24 LAB — MICROSCOPIC EXAMINATION

## 2023-11-24 LAB — URINALYSIS, ROUTINE W REFLEX MICROSCOPIC
Bilirubin, UA: NEGATIVE
Glucose, UA: NEGATIVE
Ketones, UA: NEGATIVE
Leukocytes,UA: NEGATIVE
Nitrite, UA: NEGATIVE
Protein,UA: NEGATIVE
Specific Gravity, UA: 1.01 (ref 1.005–1.030)
Urobilinogen, Ur: 0.2 mg/dL (ref 0.2–1.0)
pH, UA: 6 (ref 5.0–7.5)

## 2023-11-24 LAB — BLADDER SCAN AMB NON-IMAGING: Scan Result: 50

## 2023-11-24 MED ORDER — CEPHALEXIN 500 MG PO CAPS: 500.0000 mg | ORAL_CAPSULE | Freq: Two times a day (BID) | ORAL | 1 refills | Status: AC

## 2023-11-25 ENCOUNTER — Telehealth: Payer: Self-pay | Admitting: Urology

## 2023-11-25 DIAGNOSIS — R310 Gross hematuria: Secondary | ICD-10-CM

## 2023-11-25 NOTE — Telephone Encounter (Signed)
 Patient returned call requesting Microscopic Examination results.   Test was completed on 11/25/23   Best number to contact patient [phone] Is it OK to leave a voicemail with results yes  *pt wants to know if provider still wants her to be prn considering results. Please Advise.

## 2023-11-25 NOTE — Telephone Encounter (Signed)
**Note De-identified  [place] Obfuscation** Please advise 

## 2023-11-30 ENCOUNTER — Encounter (HOSPITAL_COMMUNITY): Payer: Self-pay | Admitting: Psychiatry

## 2023-11-30 ENCOUNTER — Ambulatory Visit (INDEPENDENT_AMBULATORY_CARE_PROVIDER_SITE_OTHER): Admitting: Psychiatry

## 2023-11-30 VITALS — BP 128/89 | HR 80 | Ht 69.0 in | Wt 263.8 lb

## 2023-11-30 DIAGNOSIS — F332 Major depressive disorder, recurrent severe without psychotic features: Secondary | ICD-10-CM | POA: Diagnosis not present

## 2023-11-30 DIAGNOSIS — F4310 Post-traumatic stress disorder, unspecified: Secondary | ICD-10-CM

## 2023-11-30 DIAGNOSIS — F411 Generalized anxiety disorder: Secondary | ICD-10-CM

## 2023-11-30 MED ORDER — CARIPRAZINE HCL 1.5 MG PO CAPS: 1.5000 mg | ORAL_CAPSULE | Freq: Every day | ORAL | 0 refills | Status: DC

## 2023-11-30 MED ORDER — FLUOXETINE HCL 40 MG PO CAPS: 40.0000 mg | ORAL_CAPSULE | Freq: Every day | ORAL | 0 refills | Status: DC

## 2023-11-30 MED ORDER — BUPROPION HCL ER (SR) 100 MG PO TB12: 100.0000 mg | ORAL_TABLET | Freq: Every day | ORAL | 1 refills | Status: DC

## 2023-11-30 NOTE — Addendum Note (Signed)
 Addended by: KOLEEN CREE C on: 11/30/2023 12:29 PM   Modules accepted: Orders

## 2023-11-30 NOTE — Telephone Encounter (Signed)
 Spoke with pt in reference to blood on ua. Lab appt made. Orders placed.
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
## 2023-12-07 ENCOUNTER — Ambulatory Visit (HOSPITAL_COMMUNITY): Admitting: Licensed Clinical Social Worker

## 2023-12-07 DIAGNOSIS — F332 Major depressive disorder, recurrent severe without psychotic features: Secondary | ICD-10-CM | POA: Diagnosis not present

## 2023-12-07 DIAGNOSIS — F4310 Post-traumatic stress disorder, unspecified: Secondary | ICD-10-CM | POA: Diagnosis not present

## 2023-12-07 DIAGNOSIS — F411 Generalized anxiety disorder: Secondary | ICD-10-CM | POA: Diagnosis not present

## 2023-12-07 NOTE — Progress Notes (Addendum)
 THERAPIST PROGRESS NOTE  Session Time: 10:00 AM to 10:40 AM  Participation Level: Active  Behavioral Response: CasualAlertappropriate  Type of Therapy: Individual Therapy  Treatment Goals addressed: Decrease depression, learn coping skills for depression, work on anxiety issues coping without medication for anxiety  ProgressTowards Goals: Progressing-patient having intrusive thoughts about mother-in-law talked about the nature of this relationship appropriate to set firm boundaries stress related to son setting consequences also thoughts and dreams of drug use the science behind that and coping involves relapse prevention tools such as playing through it saying what would happen if she did use  Interventions: Solution Focused, Strength-based, Supportive, and Other: Recovery tools, coping  Summary: Yolanda Jordan is a 58 y.o. female who presents with was depressed better increase in Prozac  helped. help. Out of bed more. Feels more like doing things.  Explain 1 thing that help husband was home for a week they spend time together enjoyed he is back to work misses him but she was able to get out of bed and get going today.  Therapist encouraged her with positive activities she does things around the house that make her feel good will help with depression possibly hobbies.  Patient explains she does do things around the house for example talked about making a stew, does flowers over the winter things that she does that are enjoyable.  What she is dealing with is intrusive thoughts about mother-in-law and getting to her.  Therapist explored with the relationship right now takes her to appointments but patient does not want to take her to appointments as she described relationship more therapist had a better understanding patient said things like she talked to me like the dog always nit picking at her.  Clear this is a relationship she needs to set her boundaries and she is in the process of that.   Mother-in-law calling to take her to appointment next week and patient will tell her did not give enough notice she invited herself to Thanksgiving and patient is going to work on back paddle on that.  Has been supports her with this this is his stepmother never liked her.  Reviewed another stressor son and getting in trouble at school has been dealing with his behavior problems since age 69 patient says he acts like he is a lot younger and therapist noted this is usually the issue with developmental issues that play out in ODD and ADHD which he has.  This time he mooned the class he thinks it is funny he doesn't understand locker room behavior is not for class room. He was suspended for 2 days.  Therapist noted her use of consequence is helpful take away the things that he cares about such as the car and phone patient also made rule that if he is in trouble at school he is in trouble at home.  Therapist thinks things he cares about will help get the message at the same time therapist encouraged working on helping facilitate development talked to him about how other people do not see things the way he does saying that can be upsetting to people can actually lead to him getting into legal trouble, breaking the rules in classroom setting.  Therapist noted with ODD a lot of times it is learning skills in emotional regulation, impulsivity, listening to authority but very challenging parents waiting for kids to grow up to help with that.  Talked about patient having thoughts and dreams of drugs both cocaine and marijuana has  not used since 2013 wonders how that is possible. Therapist reviewed literature with patient that even after 12 years of sobriety is not uncommon for individuals to occasionally experience thoughts or dreams about past drug use.  Therapist shared her own knowledge that everything that happens is stored in the subconscious.  More specifically there is neurological imprinting cocaine and marijuana use  creates strong neural pathways in the brain's reward system these pathways do not vanish completely even after long-term abstinence. There is REM rebound in memory reconsolidation dreams often reflect unresolved emotions or memories so the dreams may be reprocessing these experiences so they are stored so this may be reason they are activated. Patient herself says talking about it helps talked about journaling as another way to process emotions that may help resolve so they are not so activated.  Also triggers and stress life stressors anniversaries or exposure to reminders can activate old associations lead to dreams or intrusive thoughts.  Therapist discussed how this pathway is so deep given how effective it was. how much pleasure it gives as the literature says, so may still be present to a certain extent and see stress triggering it.  However drug-related dreams are most common in the first 6 to 12 months but long-term recovery some individuals report occasional dreams of thoughts years even decades after quitting especially during times of emotional vulnerability or stress.  What helps reduce these experiences again journaling therapy particularly trauma informed relapse prevention coping skills, mindfulness and grounding techniques help reduce emotional reactivity to dreams.  It is not necessarily a sign of relapse but can be unsettling what matters is how the person responds to the treatment patient is using relapse coping thinking about the consequences legally physically therapist adding consequences life unraveling with drug use, more problems surfacing, therapist encouraged patient with coping that would actually help address problems and not like with drug use which is only short-term and causes more problems, ways to cope that are not destructive for her. Patient recognizes this is impulsivity that has to be worked on.  Therapist noted the recovery tool plan to tape through.  Regular regulation of  emotions, learning strategies helps to maintain balance so we are not so vulnerable.  Reviewed homework positive the patient went to the farmers market she has agoraphobia and actually went as a positive step and progress in treatment.  Suicidal/Homicidal: No  Plan: Return again in 4 weeks.2.  Work on current stressors any mood symptoms support patient with continued progress  Diagnosis: Major depressive disorder, recurrent severe generalized anxiety disorder, PTSD  Collaboration of Care: Other none needed  Patient/Guardian was advised Release of Information must be obtained prior to any record release in order to collaborate their care with an outside provider. Patient/Guardian was advised if they have not already done so to contact the registration department to sign all necessary forms in order for us  to release information regarding their care.   Consent: Patient/Guardian gives verbal consent for treatment and assignment of benefits for services provided during this visit. Patient/Guardian expressed understanding and agreed to proceed.   Ronal Sink, LCSW 12/07/2023

## 2023-12-21 ENCOUNTER — Ambulatory Visit (HOSPITAL_COMMUNITY): Admitting: Licensed Clinical Social Worker

## 2023-12-28 ENCOUNTER — Ambulatory Visit (INDEPENDENT_AMBULATORY_CARE_PROVIDER_SITE_OTHER): Admitting: Psychiatry

## 2023-12-28 ENCOUNTER — Encounter (HOSPITAL_COMMUNITY): Payer: Self-pay | Admitting: Psychiatry

## 2023-12-28 ENCOUNTER — Other Ambulatory Visit: Payer: Self-pay

## 2023-12-28 ENCOUNTER — Other Ambulatory Visit

## 2023-12-28 VITALS — BP 117/90 | HR 89 | Ht 69.0 in | Wt 270.6 lb

## 2023-12-28 DIAGNOSIS — F411 Generalized anxiety disorder: Secondary | ICD-10-CM

## 2023-12-28 DIAGNOSIS — F4310 Post-traumatic stress disorder, unspecified: Secondary | ICD-10-CM | POA: Diagnosis not present

## 2023-12-28 DIAGNOSIS — F332 Major depressive disorder, recurrent severe without psychotic features: Secondary | ICD-10-CM | POA: Diagnosis not present

## 2023-12-28 DIAGNOSIS — R310 Gross hematuria: Secondary | ICD-10-CM

## 2023-12-28 LAB — MICROSCOPIC EXAMINATION

## 2023-12-28 LAB — URINALYSIS, ROUTINE W REFLEX MICROSCOPIC
Bilirubin, UA: NEGATIVE
Glucose, UA: NEGATIVE
Ketones, UA: NEGATIVE
Leukocytes,UA: NEGATIVE
Nitrite, UA: NEGATIVE
Protein,UA: NEGATIVE
Specific Gravity, UA: 1.015 (ref 1.005–1.030)
Urobilinogen, Ur: 0.2 mg/dL (ref 0.2–1.0)
pH, UA: 6 (ref 5.0–7.5)

## 2023-12-28 MED ORDER — BUPROPION HCL ER (SR) 100 MG PO TB12: 100.0000 mg | ORAL_TABLET | Freq: Every day | ORAL | 1 refills | Status: DC

## 2023-12-28 MED ORDER — FLUOXETINE HCL 20 MG PO CAPS: 60.0000 mg | ORAL_CAPSULE | Freq: Every day | ORAL | 1 refills | Status: DC

## 2023-12-28 MED ORDER — ARIPIPRAZOLE 5 MG PO TABS: 2.5000 mg | ORAL_TABLET | Freq: Every day | ORAL | 1 refills | Status: DC
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
## 2024-01-03 ENCOUNTER — Ambulatory Visit (INDEPENDENT_AMBULATORY_CARE_PROVIDER_SITE_OTHER): Admitting: Licensed Clinical Social Worker

## 2024-01-03 DIAGNOSIS — F332 Major depressive disorder, recurrent severe without psychotic features: Secondary | ICD-10-CM | POA: Diagnosis not present

## 2024-01-03 DIAGNOSIS — F4310 Post-traumatic stress disorder, unspecified: Secondary | ICD-10-CM

## 2024-01-03 DIAGNOSIS — F411 Generalized anxiety disorder: Secondary | ICD-10-CM
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
## 2024-02-01 ENCOUNTER — Ambulatory Visit (HOSPITAL_COMMUNITY): Admitting: Licensed Clinical Social Worker

## 2024-02-29 ENCOUNTER — Other Ambulatory Visit (HOSPITAL_COMMUNITY): Payer: Self-pay | Admitting: Psychiatry

## 2024-03-07 ENCOUNTER — Ambulatory Visit (INDEPENDENT_AMBULATORY_CARE_PROVIDER_SITE_OTHER): Admitting: Psychiatry

## 2024-03-07 ENCOUNTER — Other Ambulatory Visit: Payer: Self-pay

## 2024-03-07 ENCOUNTER — Encounter (HOSPITAL_COMMUNITY): Payer: Self-pay | Admitting: Psychiatry

## 2024-03-07 VITALS — BP 124/90 | HR 83 | Ht 69.0 in | Wt 278.6 lb

## 2024-03-07 DIAGNOSIS — Z8659 Personal history of other mental and behavioral disorders: Secondary | ICD-10-CM | POA: Diagnosis not present

## 2024-03-07 DIAGNOSIS — F329 Major depressive disorder, single episode, unspecified: Secondary | ICD-10-CM

## 2024-03-07 DIAGNOSIS — F411 Generalized anxiety disorder: Secondary | ICD-10-CM | POA: Diagnosis not present

## 2024-03-07 DIAGNOSIS — F332 Major depressive disorder, recurrent severe without psychotic features: Secondary | ICD-10-CM

## 2024-03-07 DIAGNOSIS — F4310 Post-traumatic stress disorder, unspecified: Secondary | ICD-10-CM | POA: Diagnosis not present

## 2024-03-07 DIAGNOSIS — F319 Bipolar disorder, unspecified: Secondary | ICD-10-CM

## 2024-03-07 MED ORDER — ARIPIPRAZOLE 5 MG PO TABS: 2.5000 mg | ORAL_TABLET | Freq: Every day | ORAL | 1 refills | Status: AC

## 2024-03-07 MED ORDER — BUPROPION HCL ER (SR) 200 MG PO TB12: 200.0000 mg | ORAL_TABLET | Freq: Every day | ORAL | 0 refills | Status: DC
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
## 2024-04-03 ENCOUNTER — Other Ambulatory Visit (HOSPITAL_COMMUNITY): Payer: Self-pay | Admitting: Psychiatry

## 2024-04-04 ENCOUNTER — Ambulatory Visit (HOSPITAL_COMMUNITY): Admitting: Psychiatry

## 2024-04-20 ENCOUNTER — Ambulatory Visit (HOSPITAL_COMMUNITY): Admitting: Psychiatry
